# Patient Record
Sex: Female | Born: 1965
Health system: Southern US, Community
[De-identification: ages and names within clinical notes are randomized; demographics above are authoritative.]

## PROBLEM LIST (undated history)

## (undated) DIAGNOSIS — R519 Headache, unspecified: Secondary | ICD-10-CM

## (undated) DIAGNOSIS — Z8489 Family history of other specified conditions: Secondary | ICD-10-CM

## (undated) DIAGNOSIS — N200 Calculus of kidney: Secondary | ICD-10-CM

## (undated) DIAGNOSIS — R51 Headache: Secondary | ICD-10-CM

## (undated) DIAGNOSIS — F32A Depression, unspecified: Secondary | ICD-10-CM

## (undated) DIAGNOSIS — F329 Major depressive disorder, single episode, unspecified: Secondary | ICD-10-CM

## (undated) HISTORY — DX: Headache: R51

## (undated) HISTORY — DX: Headache, unspecified: R51.9

## (undated) HISTORY — DX: Major depressive disorder, single episode, unspecified: F32.9

## (undated) HISTORY — PX: TONSILLECTOMY AND ADENOIDECTOMY: SHX28

## (undated) HISTORY — PX: MOUTH SURGERY: SHX715

## (undated) HISTORY — DX: Depression, unspecified: F32.A

## (undated) HISTORY — PX: LITHOTRIPSY: SUR834

---
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
## 2001-11-30 ENCOUNTER — Encounter: Payer: Self-pay | Admitting: Family Medicine

## 2001-11-30 ENCOUNTER — Encounter: Admission: RE | Admit: 2001-11-30 | Discharge: 2001-11-30 | Payer: Self-pay | Admitting: Family Medicine

## 2002-05-07 ENCOUNTER — Encounter: Admission: RE | Admit: 2002-05-07 | Discharge: 2002-05-07 | Payer: Self-pay | Admitting: Family Medicine

## 2002-05-07 ENCOUNTER — Encounter: Payer: Self-pay | Admitting: Family Medicine

## 2002-05-11 ENCOUNTER — Inpatient Hospital Stay (HOSPITAL_COMMUNITY): Admission: EM | Admit: 2002-05-11 | Discharge: 2002-05-13 | Payer: Self-pay | Admitting: Emergency Medicine

## 2002-05-11 ENCOUNTER — Encounter: Payer: Self-pay | Admitting: Emergency Medicine

## 2002-05-12 ENCOUNTER — Encounter: Payer: Self-pay | Admitting: Internal Medicine

## 2003-03-07 ENCOUNTER — Inpatient Hospital Stay (HOSPITAL_COMMUNITY): Admission: EM | Admit: 2003-03-07 | Discharge: 2003-03-12 | Payer: Self-pay | Admitting: Emergency Medicine

## 2003-05-29 ENCOUNTER — Inpatient Hospital Stay (HOSPITAL_COMMUNITY): Admission: EM | Admit: 2003-05-29 | Discharge: 2003-05-31 | Payer: Self-pay | Admitting: Emergency Medicine

## 2006-03-22 ENCOUNTER — Emergency Department (HOSPITAL_COMMUNITY): Admission: EM | Admit: 2006-03-22 | Discharge: 2006-03-22 | Payer: Self-pay | Admitting: Emergency Medicine

## 2006-04-04 ENCOUNTER — Ambulatory Visit (HOSPITAL_COMMUNITY): Admission: RE | Admit: 2006-04-04 | Discharge: 2006-04-04 | Payer: Self-pay | Admitting: Gastroenterology

## 2006-05-07 ENCOUNTER — Encounter: Admission: RE | Admit: 2006-05-07 | Discharge: 2006-05-07 | Payer: Self-pay | Admitting: Gastroenterology

## 2008-04-07 ENCOUNTER — Emergency Department (HOSPITAL_COMMUNITY): Admission: EM | Admit: 2008-04-07 | Discharge: 2008-04-07 | Payer: Self-pay | Admitting: Emergency Medicine

## 2010-06-12 NOTE — Discharge Summary (Signed)
Kathleen Hays, Kathleen Hays                       ACCOUNT NO.:  [id number]   MEDICAL RECORD NO.:  [id number]                   PATIENT TYPE:  INP   LOCATION:  0370                                 FACILITY:  [facility]   PHYSICIAN:  Rosanne Sack, M.D.         DATE OF BIRTH:  [birth year]   DATE OF ADMISSION:  03/07/2003  DATE OF DISCHARGE:  03/12/2003                                 DISCHARGE SUMMARY   CHIEF COMPLAINT:  Facial pain, anorexia, hypotension.   DISCHARGE DIAGNOSES:  1. Hypotension and severe dehydration, resolved with intravenous hydration.     a. At discharge, taking oral fluids well.  2. Hypokalemia/hypophosphatemia secondary to malnutrition.     a. Phosphorus and potassium normalized, status post replacement.     b. Continues on Neutra-Phos supplementation post discharge.  Request        repeat magnesium, phosphorus, potassium levels within 1 week post        discharge.  3. Leukocytosis and low-grade fever, ? viral etiology.     a. Resolved leukocytosis and normalized temperature, despite        discontinuance of initial antibiotic therapy.     b. Chest x-ray done March 08, 2003 noted mild chronic obstructive        pulmonary disease with no acute abnormality.     c. CT scan of the sinuses, March 08, 2003, noted bilateral maxillary        sinus retention cysts but no sinusitis.     d. Urinalysis was unremarkable.  4. Anorexia nervosa and major depression.     a. Status post psychiatry consult, Dr. Antonietta Breach.  Axis I diagnoses        -- major depression disorder described as severe, anorexia nervosa,        and anxiety disorder, unspecific, rule out posttraumatic stress        disorder.  5. Headache, resolved.  6. History of hospitalization at [facility], May 11, 2002 through May 29, 2002, under Dr. Lina Sar for anorexia and colonic ileus.     a. History of irritable bowel syndrome.     b. History of chronic recurrent sinusitis, followed  previously by Dr.        Zola Button T. Wolicki, ears, nose and throat.     c. History of laxative abuse and abuse of carbohydrate blockers.     d. Apparent history of elevated liver function tests per primary care,        [facility].  7. Allergies listed:  No known drug allergies.   DISCHARGE MEDICATIONS:  1. Lexapro 20 mg daily.  2. Multivitamin 1 tablet daily.  3. Librium 25 mg 4 times daily.  4. Nicotine patch 21 mg daily to wean per manufacturer's instructions and     obtain over the counter.  5. Wellbutrin XL 150 mg daily.  6. Protonix 40 mg once daily.  7. Neutra-Phos 1 packet twice daily.  SPECIAL DISCHARGE INSTRUCTIONS:  1. Disposition:  Patient discharged to [facility] located in     [place].  2. Activity unrestricted.  3. Diet unrestricted.  4. Request followup metabolic panel with electrolytes, phosphorus, magnesium     level within 1 week post discharge.  5. Request followup, [facility], post discharge from [place] facility.  6. Repeat serum TSH and free T4 two to three weeks post discharge (see labs     below).   CONSULTS OVER THE COURSE OF HOSPITALIZATION:  Dr. Jeanie Sewer, psychiatry.   PROCEDURES OVER THE COURSE OF HOSPITALIZATION:  CT scan of the sinuses done,  March 08, 2003, with report as above.   DISCHARGE LABORATORIES:  Serum phosphorus done March 11, 2003 was 2.9.  CBC, March 09, 2003, found WBC 7.2, hemoglobin 14.4, hematocrit 42.1,  platelets 221,000.  A metabolic panel, March 09, 2003:  Sodium 140,  potassium 3.8, chloride 108, CO2 29, BUN 4, and creatinine 0.7.  Glucose was  102.  Admission liver function tests were unremarkable except for a slightly  low albumin at 3.4.  Followup potassium level, March 10, 2003, was 4.2.  Serum TSH was low at 0.114 with a free T4 low at 0.66, essentially ruling  out hyperthyroidism.  Serum cortisol A.M. mildly elevated at 23.9.  HCG   quantitative less than 2.  Drug screen positive for benzodiazepine only.  Urinalysis unremarkable.   RADIOLOGY:  As above.   HISTORY OF PRESENT ILLNESS:  [age]-year-old female followed as an  outpatient of Dr. Marjory Lies in primary care, [facility].  She presented to the emergency room, March 07, 2003, following  a 2-day history of facial pain, headache described as severe.  She was seen  by Dr. Lazarus Salines, ENT, for what sounds like recurrent sinusitis and postnasal  drip.  She complained of left ear pain.  She could not arrange an  appointment with Dr. Lazarus Salines and came to the emergency room.  She was found  to be hypotensive initially and not responsive to IV fluids but eventually  came up in her blood pressure after several fluid boluses.  The patient has  a history of anorexia per her husband's report.  She was hospitalized May 11, 2002 through May 30, 2002 for acute colonic ileus secondary to starvation  state, laxative abuse, major depression.  She has been followed for the  latter by Dr. Cleone Slim.  Depression and anorexia followed by Dr. Pricilla Riffle,  psychology.  She has refused inpatient treatment in the past.  For dictated  review of systems, admission medications, physical exam, laboratory data,  impressions and plan, please see dictated admission and physical dated  March 07, 2003.   HOSPITAL COURSE:  PROBLEM #1 - HYPOTENSION SECONDARY TO SEVERE DEHYDRATION:  The patient was found hypotensive by the emergency room staff at [facility].  She was bolused to IV fluids and eventually her blood pressures  increased to 100/70.  Her metabolic panel suggested dehydration with  elevation in her BUN and creatinine to 10 and 0.8.  Her potassium was low at  3.1.  She was hydrated with IV fluids and her diet initiated.  With  hydration, her BUN and creatinine normalized and by discharge, BUN was 4 and  creatinine 0.7.  By discharge, she is taking fluids and  meals well.  PROBLEM #2 - HYPOKALEMIA AND HYPOPHOSPHATEMIA THOUGHT SECONDARY TO  MALNUTRITION:  On admission, serum potassium low at  3.1.  This was  supplemented and a magnesium and phosphorus level were checked.  Serum  magnesium on admission was 2.4, in normal range, but phosphorus low, 1.9.  The patient was supplemented first with IV phosphorus and later oral  phosphorus.  Her serum phosphorus level has gradually improved and by  March 11, 2003, had normalized to 2.9.  The patient is continued on  Neutra-Phos post discharge.  She should have repeat metabolic panel,  magnesium, phosphorus levels within 1 week post discharge.   PROBLEM  #3 - LEUKOCYTOSIS AND LOW-GRADE FEVER:  On presentation, the  patient was noted to have a low-grade fever in the 99 range.  Her WBC was  noted elevated at 13.8.  She was started on Augmentin antibiotic therapy  with a known history of recurrent sinusitis.  A CT scan of the sinuses  obtained indicated maxillary sinus retention cysts but no acute sinusitis.  A chest x-ray was obtained and this was unremarkable.  Urinalysis was  obtained and this was unremarkable.  Her antibiotics were discontinued and  despite this, the patient's fevers resolved and her leukocytosis normalized  to 7.2.  Etiology felt likely to be a viral infection.   PROBLEM #4 - ANOREXIA NERVOSA AND DEPRESSION:  The patient does have a  previous listed history per husband report of anorexia.  Noted laxative and  carbohydrate blocker abuse.  She had been followed as an outpatient by Drs.  Karb and Pyle.  She had an inpatient psychiatry consult by Dr. Jeanie Sewer.  Axis I diagnoses of major depressive disorder described as severe, anorexia  nervosa, and anxiety disorder, unspecified, rule out post-traumatic stress  disorder.  Discussions were conducted with the patient, her husband  regarding possible placement in an anorexia treatment program (inpatient)  within the state of [place].  Apparently, this was unavailable.  Arrangements have been made for placement at [facility] in [place] and the patient will be discharged directly there for treatment.  Following discharge from [facility], the patient is to follow up  with her primary care provider at [facility].   PROBLEM #5 - HEADACHE:  On presentation, the patient described a headache as  quite severe.  CT scan of the sinuses obtained as indicated above.  No  indication of acute sinusitis.  Most likely etiology again felt to be a  viral infection which has resolved.  There have been no further complaints  of headache.   PROBLEM #6 - ABNORMAL THYROID FUNCTION TESTS:  The patient did have a serum  TSH obtained on admission which was low at 0.114.  A free T4 was obtained to  evaluate for hyperthyroidism and this was also low, 0.66, essentially ruling  out hyperthyroidism.  The etiology of abnormal thyroid function tests unclear but possibly malnutrition.  Would follow up a TSH level 2 to 3 weeks  post discharge and include a free T4 level.   DISPOSITION:  Discharged to [facility] [place], for further  treatment of anorexia nervosa as an outpatient there.   ACTIVITY:  Unrestricted.   CONDITION AT TIME OF DISCHARGE:  Stable/improved.   DISCHARGE PROCESS:  Greater than 30 minutes, 7:15 a.m. through 8:10 a.m.     Everett Graff, N.P.                     Rosanne Sack, M.D.    TC/MEDQ  D:  03/12/2003  T:  03/12/2003  Job:  [id number]   cc:   Marjory Lies, M.D.  [address]  [place]  [place] [address]  Fax: [phone]   Gloris [place]. Lazarus Salines, M.D.  [address] [place]  [place] [address]  Fax: [phone]   Dr. Cleone Slim   Dr. Pricilla Riffle, psychology   Antonietta Breach, M.D.  [address]  [place], [place] [address]  Fax: [phone]

## 2010-06-12 NOTE — H&P (Signed)
NAME:  Kathleen Hays, Kathleen Hays                       ACCOUNT NO.:  [id number]   MEDICAL RECORD NO.:  [id number]                   PATIENT TYPE:  INP   LOCATION:  1824                                 FACILITY:  [facility]   PHYSICIAN:  Karlene Einstein, M.D.             DATE OF BIRTH:  05/12/1965   DATE OF ADMISSION:  05/29/2003  DATE OF DISCHARGE:                                HISTORY & PHYSICAL   CHIEF COMPLAINT:  Drug overdose.   HISTORY OF PRESENT ILLNESS:  A 45 year old, white female who was brought to  [facility] by husband secondary to drug overdose early this  morning.  The patient has a long history of anorexia just treated the first  part of this year as inpatient x30 days.  Since then, she has been depressed  about her weight.  He saw her last at 10:30 p.m. when he went to bed.  The  patient woke her husband up around 3 a.m. stating that the room was  spinning.  Husband checked her medication bottles and found that recently  filled bottle of 20 mg Lexapro was empty.  He says she must have taken as  many as 23 pills because according to the pill count, only seven pills  should be missing; however, the bottle was empty.  At that time, the patient  was not answering questions.  Currently, in the emergency room, the patient  says that she feels better.  The room is not spinning anymore, but she  cannot get up and maintain her balance.  She is not hungry.  When asked  about whey she took so many pills, all she said is that I must have  mistaken it for aspirin.  She was taking aspirin daily for stuffed ears.  She says she has recurrence in her ears for which she takes aspirin.  She  swears that she only took the maximum limit.  The patient denies suicidal  attempt.   REVIEW OF SYMPTOMS:  CONSTITUTIONAL:  No fevers, chills or night sweats.  HEENT:  Eyes with no blurry vision, no diplopia or dry eyes.  Bilateral ear  congestion and tinnitus, but no epistaxis or bleeding.   RESPIRATORY:  No  cough, shortness of breath or chest pain.  CARDIOVASCULAR:  No palpitations,  edema.  GASTROINTESTINAL:  No abdominal pain, nausea, diarrhea or vomiting.  GU:  No dysuria, hematuria or increased frequency.  MUSCULOSKELETAL:  No  pain, weakness or stiffness.  SKIN:  No rash, pruritus or purpura.  NEUROLOGIC:  Had balance problems, otherwise no numbness or tingling.  Feels  somewhat weak.   PAST MEDICAL HISTORY:  Severe depression and anorexia, but no history of  suicidal attempts.   SOCIAL HISTORY:  Denies alcohol and smoking.  She is currently married and  lives with her husband.   FAMILY HISTORY:  Significant for suicide in her father.   ALLERGIES:  No known drug allergies.  CURRENT MEDICATIONS:  1. Lexapro 20 mg q.d.  2. Wellbutrin.  3. Protonix 40 mg q.d.  4. Aspirin.   PHYSICAL EXAMINATION:  VITAL SIGNS:  Temperature 98.1, blood pressure 88/45,  pulse 81, respirations 16, pulse oximetry 98% on room air.  GENERAL:  No acute distress.  Cooperative.  HEENT:  Pupils equal round and reactive to light and accommodation.  Extraocular movements intact.  Oropharynx moist and clear.  Tympanic  membranes intact.  Mild cerumen in bilateral ears.  NECK:  Supple.  No JVD or thyromegaly.  No cervical lymphadenopathy.  LUNGS:  Clear to auscultation bilaterally.  No rales, rhonchi or wheezing.  HEART:  Regular rate and rhythm with normal S1, S2.  No murmurs, rubs or  gallops.  ABDOMEN:  Soft, nontender, nondistended.  Decreased bowel sounds.  No  organomegaly.  EXTREMITIES:  2+ pulses.  No edema.  SKIN:  No rashes, purpura or petechiae.  MUSCULOSKELETAL:  Full range of motion.  No tenderness or deformities.  NEUROLOGIC:  Alert and oriented x3.  No focal deficits.  Gait not tested.   LABORATORY DATA AND X-RAY FINDINGS:  Creatinine 0.8, sodium 140, potassium  3.4, chloride 110, BUN 20, glucose 102.  ABG with pCO2 30.6, bicarb 18.7.  Hemoglobin 16.3.  Alcohol level less  than 5.  Salicylate level 27.6.  Troponin I 0.01.  Acetaminophen level less than 10.  Repeat blood work shows  acetaminophen level less than 10, salicylate level 20.7.  Sodium 139,  potassium 3.5, bicarb 21, glucose 98, creatinine 0.7, calcium 7.6.   EKG shows normal sinus rhythm, prolonged QT, otherwise normal EKG.   ASSESSMENT/PLAN:  1. Drug overdose.  Salicylate and Lexapro which is a selective serotonin     reuptake inhibitor.  The patient is symptomatically improved.  Her drug     levels are also coming down.  Electrolytes are normal.  I will admit her     to intensive care unit mainly for observation and management of any     complications.  Will hold all her medications for now.  Will use suicide     precautions, although she is in denial.  Also, consider psychiatric     consult.  Will recheck a basic metabolic panel, salicylate level and     urinary pH in two hours.  Also, check urine pregnancy test, magnesium     level and protime according to poison center guidelines.  2. Severe depression.  She was on Lexapro and Wellbutrin.  Will hold off on     those medications.  Consider psychiatric consult.  3. Anorexia.  She just completed an inpatient program in [place].  4. Gastroesophageal reflux disease.  Hold Protonix for now.  5. Bilateral ear congestion.  No sign of infection.  I think her symptoms     are secondary to salicylate overdose.  6. Decreased blood pressure.  Fluid boluses were given.  Follow closely.  7. The patient will be followed by Dr. Sharon Seller in the hospital.                                                Karlene Einstein, M.D.    GD/MEDQ  D:  05/29/2003  T:  05/29/2003  Job:  [id number]

## 2010-06-12 NOTE — H&P (Signed)
NAME:  Kathleen Hays, Kathleen Hays                       ACCOUNT NO.:  [id number]   MEDICAL RECORD NO.:  [id number]                   PATIENT TYPE:  EMS   LOCATION:  ED                                   FACILITY:  [facility]   PHYSICIAN:  Lina Sar, M.D. [facility]               DATE OF BIRTH:  1965/04/19   DATE OF ADMISSION:  05/11/2002  DATE OF DISCHARGE:                                HISTORY & PHYSICAL   CHIEF COMPLAINT:  Abdominal pain and bloating.   HISTORY OF PRESENT ILLNESS:  The patient is a 45 year old white female,  primary patient of Dr. Doristine Counter, with no chronic previously diagnosed medical  problems except irritable bowel syndrome, also with chronic sinus problems.  She was seen in consult by Dr. Arlyce Dice apparently a few weeks ago for  abdominal pain and bloating, and had undergone colonoscopy which was  apparently negative with the exception of one small polyp.  She had been  started on Zelnorm approximately one month ago, and had been overusing this  without much success.  She had called and then was started on Reglan 5 mg  t.i.d. and told to decrease the Zelnorm to b.i.d. which apparently seemed to  help briefly.   The patient and her husband state that most of her GI symptoms have all  started within the past couple of months and have been worse over the past  few weeks, and acutely worse though with constant abdominal discomfort and  distention associated with nausea over the past five days.  She has been  unable to eat over the past four days due to abdominal tightness, and says  she has been having sweats off and on as well.  She had one episode of  vomiting.  Her stools have been loose with two to three bowel movements per  day, no melena or heme.  After further discussion, the patient admits to  using laxatives over the past couple of years, including Correctol, Surfact,  Dulcolax, etc.  Also has been using Car Blockers and Stacker II products  which I believe are stimulants,  creatine muscle builder type products both  for the past couple of years and in higher then the recommended doses.  She  also has been following a high protein, low carb diet.  Her husband  relates she generally eats a protein bar in the morning and a salad for  dinner and not much else.  Her weight has dropped from 140 to 110 over the  past year, and despite this has continued using all of the above until this  past Monday with her acute abdominal distention.  She denies other substance  abuse, herbal products, etc.  Denies alcohol use.  She does admit to  significant depression which has been worse over the past few months, and  has never been treated.  Her husband says she has been depressed since 1991  when her father  committed suicide and the patient found him.  She is tearful  today about that and says she misses her father.  She currently denies any  suicidal ideation, but says she might if somebody doesn't help me, I feel  terrible.  The patient is admitted at this time for further diagnostic  evaluation and treatment to include psychiatric evaluation.   LABORATORY DATA:  White blood cell count 7.5, hemoglobin 14, hematocrit 39,  MCV 97, prothrombin time 11.7, INR 0.8, potassium 3, chloride 115, BUN 15,  creatinine 0.8.  Alkaline phosphatase 33, albumin 3.3, venous ammonia is 32,  lipase is 79.  Beta hCG was negative, and urinalysis was negative.  KUB in  the emergency room shows an adynamic ileus.  CT scan of the abdomen and  pelvis shows a complete lack of fat with some evidence of subcutaneous edema  consistent with a cachectic/malnourished state making CT scan difficult.  She is noted to have multiple low density lesions in the liver, probable  multiple small cysts.  Her colon is diffusely distended and fluid filled,  and her pancreas is felt to be normal.   CURRENT MEDICATIONS:  1. Flonase two sprays b.i.d.  2. Reglan 5 mg t.i.d.  3. Zelnorm 6 mg b.i.d. and supplements as  outlined.  4. Also question if she is taking creatine.   ALLERGIES:  No known drug allergies.   PAST MEDICAL HISTORY:  1. Status post T&A.  2. Status post cesarean section x1.  No other hospitalizations or known chronic problems.   FAMILY HISTORY:  One aunt with colon CA.  Mother with thyroid disease.  Father deceased secondary to suicide.   SOCIAL HISTORY:  The patient is married, not currently employed.  She has  two children.  She is a smoker one pack per day.  No ETOH.   REVIEW OF SYMPTOMS:  CARDIOVASCULAR:  Denies any chest pain or anginal  symptoms.  PULMONARY:  Denies any cough, shortness of breath, or sputum  production.  GENITOURINARY:  Denies any dysuria, urgency, or hematuria.  MUSCULOSKELETAL:  Denies myalgias, arthralgias, etc.  SKIN:  The patient has  had a chronic rash on her feet over the past several months and a scaly  lesion on her left calf which she says has been present for a long time.  NEUROLOGIC:  Depression as above.  GYNECOLOGIC:  No menses for several  months.  No GYN examination for several years.   PHYSICAL EXAMINATION:  GENERAL:  A well-developed chronically ill-appearing  white female with hat and hair extensions.  She is tearful, little eye  contact, husband is in the room.  VITAL SIGNS:  Temperature 97.9, blood pressure 114/60, pulse 80,  respirations 16.  HEENT:  Normocephalic, atraumatic.  Extraocular movements were intact.  Pupils equal, round, reactive to light and accommodation.  Sclerae  anicteric.  NECK:  Supple.  There are no nodes, no JVD.  CARDIOVASCULAR:  Regular rate and rhythm with S1 and S2 and occasional  ectopic.  PULMONARY:  Clear.  ABDOMEN:  Distended, somewhat tympanitic.  Bowel sounds are hyperactive.  There is no focal tenderness, rather diffusely mildly tender, no palpable  hepatosplenomegaly.  RECTAL:  Heme negative and green per Dr. Beverely Pace. EXTREMITIES:  There is 1+ edema in the lower extremities, chronic-appearing   purpuric lesions on the feet, and a large scaly lesion on the left calf.  NEUROLOGIC:  Grossly nonfocal.  Gait not tested.   IMPRESSION:  83. A 45 year old white female with colonic ileus  likely secondary to     metabolic derangement with starvation state, laxative abuse, etc.  Rule     out underlying motility disorder.  2. Eating disorder with abuse of laxatives, supplements, carb blockers, etc.     Also question a component of anorexia.  3. Malnutrition secondary to above.  4. Weight loss secondary to above.  5. Depression, chronic, with acute major depression.  6. Multiple liver lesions, cysts versus other, may need further evaluation.  7. Colon polyp.   PLAN:  1. The patient is admitted to the service of Dr. Lina Sar for IV fluid     hydration.  We will replace her potassium, check magnesium and phosphorus     levels, and replace as needed.  She will be put on a liquid diet for the     time being, IV Reglan.  We will hold the Zelnorm.  I have consulted     psychiatry, and she may need eventual inpatient stay as well.  May need     to consider TNA unless she can get back to eating within the next few     days.  She and her husband were told that she absolutely needs to stop     all supplements, laxatives, etc., and that it is causing her significant     medical problems and that these are causing her significant medical     problems.  2. Consider MRI of the liver.   For details, please see the orders.       Mike Gip, P.A.-C. [facility]                Lina Sar, M.D. [facility]    AE/MEDQ  D:  05/11/2002  T:  05/11/2002  Job:  [id number]   cc:   Marjory Lies, M.D.  [address]  [place]  [place] [address]  Fax: [phone]

## 2010-06-12 NOTE — Discharge Summary (Signed)
NAME:  Kathleen Hays, Kathleen Hays                       ACCOUNT NO.:  [id number]   MEDICAL RECORD NO.:  [id number]                   PATIENT TYPE:  INP   LOCATION:  0463                                 FACILITY:  [facility]   PHYSICIAN:  Lina Sar, M.D. [facility]               DATE OF BIRTH:  08-Dec-1965   DATE OF ADMISSION:  05/11/2002  DATE OF DISCHARGE:  05/13/2002                                 DISCHARGE SUMMARY   ADMITTING DIAGNOSES:  1. The patient is a 45 year old white female with acute colonic ileus likely     secondary to a metabolic derangement with starvation state, laxative     abuse, etc., rule out underlying motility disorder.  2. Probable eating disorder with abuse of laxatives, supplements, carb     blockers, etc.  Also question component of anorexia.  3. Malnutrition secondary to above.  4. Weight loss secondary to above.  5. Depression, chronic, with probable acute major depression.  6. Multiple liver lesions, cysts versus other, may need further evaluation.  7. History of a colon polyp.  8. Status post cesarean section x1 and tonsillectomy and adenoidectomy.   DISCHARGE DIAGNOSES:  1. Colonic ileus, resolved.  2. Probable eating disorder with abuse of laxatives, supplements, carb     blockers, etc.  Also question component of anorexia.  3. Malnutrition secondary to above.  4. Weight loss secondary to above.  5. Depression, chronic, with probable acute major depression.  6. Multiple liver lesions, cysts versus other, may need further evaluation.  7. History of a colon polyp.  8. Status post cesarean section x1 and tonsillectomy and adenoidectomy.   CONSULTATION:  Psychiatric consult was requested on admission.  The patient  was discharged prior to being evaluated.   RADIOGRAPHIC STUDIES:  CT scan of the abdomen and pelvis and plain abdominal  films.   BRIEF HISTORY:  The patient is a 45 year old white female primary patient of  Dr. Doristine Counter with no chronic previously  diagnosed medical problems other than  irritable bowel.  She has had some chronic sinus difficulty.  She had been  seen in consult by Dr. Melvia Heaps a few weeks ago for abdominal pain and  bloating and had undergone colonoscopy which was negative with the exception  of one small polyp.  She was started on Zelnorm at that time but had been  overusing this medication without much success.  She had called our office  and was started on Reglan 5 mg three times daily and told to decrease her  Zelnorm to b.i.d. dosing which did seem to help briefly.  She presents now  to the emergency room with acute progressive abdominal discomfort and  distension with nausea and vomiting x5 days.  She and her husband report  that all of her GI symptoms started within the past couple of months and  have been worse over the past few weeks.  She related that she had  been  unable to eat for 4 days due to abdominal distension and tightness and had  been having sweats off and on as well as one episode of vomiting.  She was  having two to three loose bowel movements per day without melena or  hematochezia.  After further discussion with the patient and her husband it  was elicited that she had been using laxatives over the past couple of years  in various forms including Correctol, Surfak, Dulcolax, etc., has also been  using and/or overusing carb blockers and Stacker 2 products which I believe  have a stimulant effect, also probably a creatine supplement - all for  weight loss benefits.  She says she follows a very high protein, low carb  diet and her husband says she really does not eat much at all.  She has lost  30 pounds over the past year from 140 down to 110 and despite this has  continued all of the above supplements, etc.  She denies any substance abuse  or alcohol abuse.  She and her husband both relay that she has had  significant problems with depression which has been worse over the past few  months  and has never been treated with medication.  She has not seen a  Veterinary surgeon.  She was tearful in the emergency room talking about her father  who had committed suicide and admitted that she was very depressed but not  suicidal herself.  It was felt because of all of these problems both  physical and psychiatric in addition to metabolic derangement and evidence  of  ileus on CT scan that the patient should be admitted to the hospital for  further diagnostic evaluation and supportive medical management.   LABORATORY STUDIES:  On 05/11/2002 WBC 7.5, hemoglobin 14, hematocrit of  39.9, MCV of 97.7, platelets 194.  Followup on 05/12/2002 WBC of 6.5,  hemoglobin 14, hematocrit of 40.7, platelets 190.  Sed rate was 1.  Prothrombin time 11.7, INR 0.8, PTT of 26.  On admission potassium 3,  chloride 115, BUN 15, creatinine 0.8, albumin 3.3.  Followup on 05/12/2002  sodium 143, potassium 4.1, BUN 9, creatinine 0.8.  Liver function studies  normal.  Amylase 66, lipase of 79; followup was 63.  Venous ammonia was 32,  magnesium 2, phosphorus 3.4.  TSH 0.74.  Urine pregnancy test negative.  Prealbumin 22.4.  Drug screen negative.  UA negative.   RADIOGRAPHIC STUDIES:  KUB on 05/11/2002 showed an adynamic ileus with  scattered air-fluid levels in the large and small bowel.  CT scan of the  abdomen on 05/11/2002 shows lack of intraperitoneal and subcutaneous fat  with the visible fat appearing relatively edematous, this is nonspecific but  can be seen with cachexia and a malnourished state, diffuse fluid-filled  distension of the colon, nonspecific, otherwise negative.  Followup  abdominal films on 05/12/2002 showed a nonspecific gas pattern.   HOSPITAL COURSE:  The patient was admitted to the service of Dr. Lina Sar  who was covering the hospital.  She was rehydrated in the emergency room and then continued on IV fluids.  She was started on IV Reglan, kept on a clear  liquid diet.  We held her  Zelnorm and replaced her potassium.  She felt  significantly better by the following morning from the ileus standpoint and  abdominal films were improved, her diet was advanced and she was seen by Dr.  Virginia Rochester on 05/13/2002 who was covering on call, potassium was  corrected, her  abdomen was back to her baseline and she was feeling much better.  The  patient was requesting discharge and she was discharged to home to follow up  in the office with Dr. Arlyce Dice.  We have requested a psychiatric consult on  the evening of admission however, they were unable to evaluate her over the  weekend while she was in the hospital.  She was discharged on her usual  medications Flonase two sprays b.i.d., Reglan 5 mg three times daily, the  Zelnorm was discontinued, and she was advised to discontinue all of her  supplements, carb blockers, and laxatives.  We planned outpatient  psychiatric followup both for treatment of her depression and probable  underlying eating disorder.    CONDITION ON DISCHARGE:  Improved.   DIET ON DISCHARGE:  Clear to full liquids with gradual advancement as  tolerated.     Mike Gip, P.A.-C. [facility]                Lina Sar, M.D. [facility]    AE/MEDQ  D:  05/30/2002  T:  05/30/2002  Job:  [id number]   cc:   Marjory Lies, M.D.  [address]  [place]  [place] [address]  Fax: [phone]

## 2010-06-12 NOTE — H&P (Signed)
NAME:  Kathleen Hays, Kathleen Hays                       ACCOUNT NO.:  [id number]   MEDICAL RECORD NO.:  [id number]                   PATIENT TYPE:  EMS   LOCATION:  ED                                   FACILITY:  [facility]   PHYSICIAN:  Maxwell Caul, M.D.             DATE OF BIRTH:  Jun 26, 1965   DATE OF ADMISSION:  03/07/2003  DATE OF DISCHARGE:                                HISTORY & PHYSICAL   CHIEF COMPLAINT:  Facial pain, anorexia and hypotension.   ID:  [name] who arrived in the ER tonight with her  husband. She is a primary patient of Marjory Lies, M.D. and [facility]   HISTORY OF PRESENT ILLNESS:  The patient came in today with a two day  history of facial pain, headache which was severe. She was seen by Zola Button T.  Wolicki, M.D. for ENT in the past for what sounds like chronic recurrent  sinusitis for which she has had nasal drainage. She also complains of left  ear pain.  She could not arrange an appointment with Dr. Lazarus Salines and  eventually came to the ER.  Here she was found to be hypotensive which was  initially not responsive to IV fluid; however, it has come up after several  boluses.   The patient apparently has a long history of anorexia per her husband. She  was hospitalized from April 16 to May 30, 2002 with an acute colonic ileus  secondary to starvation state, laxative abuse, and major depression. She has  been followed for the latter by Dr. Cleone Slim. Depression and anorexia also  followed by Dr. Pricilla Riffle of psychology. She has apparently refused inpatient  treatment and the husband is really concerned about her going home tonight  and wants [place] to attempt to try to transfer her directly.   PAST MEDICAL HISTORY:  1. In [place] from April 16 to May 29, 2002 under Dr. Juanda Chance with anorexia     and colonic ileus.  2. Depression with anxiety.  3. Irritable bowel.  4. Sinusitis.  5. Laxative abuse and abuse of carb blockers.  6. Apparently  a history of elevated liver function tests done in     [place].   REVIEW OF SYMPTOMS:  CARDIAC:  No chest pain, no palpitations.  RESPIRATORY:  No shortness of breath. GI:  She had diarrhea last night, she has not eaten  much for 48 hours. Apparently in the past, she will starve herself and then  binge with ice cream although there does not seem to be any vomiting.  GU:  She has no periods for at least one year. HEENT:  She is complaining of  sinus and facial pain perhaps ethmoid sinusitis.   MEDICATIONS:  1. Lexapro 20 mg q.d.  2. Librium 25 p.o. q.d.   PHYSICAL EXAMINATION:  VITAL SIGNS:  Blood pressure currently 100/70, pulse  72.  GENERAL:  She  is a thin, disheveled female.  HEENT:  Left ear is obscured by wax. She has poor dentition.  RESPIRATORY:  Clear.  CARDIAC:  Heart sounds are normal. She is not grossly dehydrated currently.  ABDOMEN:  No liver, no spleen. She is nontender.  PSYCHIATRIC:  She is depressed, has suicidal ideation but no plan. She  expresses a wish to attend inpatient therapy for her eating disorder.  Apparently she has been prior approved through [facility] for  this although she has refused to go.   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 She has no history of alcohol, no street  drug abuse. She denies current laxative abuse.   LABORATORY DATA:  Laboratory work to date, white count is 13.8, 82%  neutrophils, hemoglobin 14.5, K 3.1, albumin 3.4, BUN 10, creatinine 0.8,  calcium 8.4, liver function tests are completely normal.  Drug screen is  negative except for BZD's, alcohol is less than 5, her urinalysis is normal.   IMPRESSION:  1. Anorexia nervosa.  I really do not think there is another medical     explanation for this phenomenon.  2. Hypotension.  She is likely mildly dehydrated. I will check a chest x-ray     and EKG and random cortisol.  3. Depression.  I  think this is significant. I do not believe she is     currently suicidal, however, it is a major reason I think she needs to     come in the hospital.  4. Hypokalemia likely secondary to #1 perhaps laxative abuse which I will     replace.  5. Increasing white cell count. The cause of this is not completely obvious.     I wonder whether she might have a sinusitis and I will empirically treat     her for this, I will check a chest x-ray as well.   PLAN:  The emergency room physician was concerned about sending this woman  home and after review I agree with him.  I would like a psyche consult  tomorrow and tonight I will give her fluids and replace her potassium. I am  doubtful that there will be another medical explanation or a problem we  identify as she had a fairly thorough workup for this in April.                                               Maxwell Caul, M.D.    MGR/MEDQ  D:  03/07/2003  T:  03/07/2003  Job:  [id number]

## 2014-03-14 ENCOUNTER — Encounter: Payer: Self-pay | Admitting: Neurology

## 2014-03-15 ENCOUNTER — Encounter: Payer: Self-pay | Admitting: Neurology

## 2014-03-15 ENCOUNTER — Ambulatory Visit (INDEPENDENT_AMBULATORY_CARE_PROVIDER_SITE_OTHER): Payer: 59 | Admitting: Neurology

## 2014-03-15 VITALS — BP 104/70 | HR 68 | Resp 16 | Ht 65.0 in | Wt 150.0 lb

## 2014-03-15 DIAGNOSIS — G43821 Menstrual migraine, not intractable, with status migrainosus: Secondary | ICD-10-CM

## 2014-03-15 DIAGNOSIS — G43909 Migraine, unspecified, not intractable, without status migrainosus: Secondary | ICD-10-CM | POA: Insufficient documentation

## 2014-03-15 DIAGNOSIS — N943 Premenstrual tension syndrome: Secondary | ICD-10-CM

## 2014-03-15 MED ORDER — TOPIRAMATE 25 MG PO TABS: 25.0000 mg | ORAL_TABLET | Freq: Two times a day (BID) | ORAL | Status: DC

## 2014-03-15 MED ORDER — SUMATRIPTAN SUCCINATE 100 MG PO TABS: 100.0000 mg | ORAL_TABLET | ORAL | Status: DC | PRN

## 2014-03-15 NOTE — Progress Notes (Signed)
[facility]   Provider:  Melvyn Novas, M D  Referring Provider: Delorse Lek, MD Primary Care Physician:  Delorse Lek, MD  Chief Complaint  Patient presents with  . Headache    RM 11  - Vision 20/200 both eyes W/O     HPI:  ASHANTAE PANGALLO is a 49 y.o. female ,  seen here as a referral/from Dr. Doristine Counter for an evaluation of nocturnal onset migraines.  Mrs. Millon reports that her migraines begun around age 81. She has a strong family history that I would like to interject here her mother started having migraine headaches in her 85s as does her daughter. The patient reports that her headaches would wake her up at 3 or 4 AM, and usually they occurred at the time of either ovulation or the first day of her menstrual period. She was not on birth control pills or hormone replacement therapy. Never undergone a hysterectomy. Her son was born in 70, her daughter in 94. Her last period was in July 2015.  She now has hot flushes.   The several years she has had seen Dr. Neale Burly and Dr. Meryl Crutch, quit smoking,  modified her diet and received multiple injection therapies. She lost weight and has a regular exercise program.   She states  she still had the same frequency of migraines but that the severity and intensity was less. There was never any aura, and no olfactory triggers.  At the current time she still has left frontal onset headaches- as she has had all these years they usually start in the middle of the night around 3 AM a headache for last up to 3 days if she can not ' nip"  that. Her headaches however are associated with nausea, photophobia and phonophobia, she is unable to drive during her headaches because she feels very dizzy and vertiginous. She has had nausea to the point of vomiting. currently 1-2 a month, not longer katamenial.  Tried Excedrin, Tylenol, ibuprofen, aspirin and she has used pressure points massage. She finds Excedrin helps her the best if she  has an over-the-counter axis, Dr. Doristine Counter recently prescribed Imitrex that 100 mg and if she takes these she is immediately pain-free. She has never used phenergan/ zofran .  Her food triggers are chocolate, coffee, tea, soda. Peanut butter is a trigger.  The patient had a tonsillectomy at age , in 65.  Review of Systems: Out of a complete 14 system review, the patient complains of only the following symptoms, and all other reviewed systems are negative.   Epworth score 5  , Fatigue severity score 25   , depression score 0    History   Social History  . Marital Status: Single    Spouse Name: [name]  . Number of Children: 2  . Years of Education: RN   Occupational History  . RN  [employer]   Social History Main Topics  . Smoking status: Former Games developer  . Smokeless tobacco: Never Used     Comment: Quit 2015  . Alcohol Use: No  . Drug Use: No  . Sexual Activity: Not on file   Other Topics Concern  . Not on file   Social History Narrative   Patient lives at home with her husband [name]  and daughter.   Patient works at [employer]    [employer] Service fast streamer.   Right handed.   Caffeine none.    Family History  Problem Relation Age  of Onset  . High blood pressure Mother   . Migraines Mother     Past Medical History  Diagnosis Date  . HA (headache)     Past Surgical History  Procedure Laterality Date  . Cesarean section    . Tonsillectomy and adenoidectomy    . Mouth surgery      Current Outpatient Prescriptions  Medication Sig Dispense Refill  . SUMAtriptan (IMITREX) 100 MG tablet Take 100 mg by mouth every 2 (two) hours as needed for migraine or headache. May repeat in 2 hours if headache persists or recurs.     No current facility-administered medications for this visit.    Allergies as of 03/15/2014 - Review Complete 03/15/2014  Allergen Reaction Noted  . Avelox [moxifloxacin hcl in nacl]  03/14/2014    Vitals: BP 104/70 mmHg  Pulse  68  Resp 16  Ht 5\' 5"  (1.651 m)  Wt 150 lb (68.04 kg)  BMI 24.96 kg/m2 Last Weight:  Wt Readings from Last 1 Encounters:  03/15/14 150 lb (68.04 kg)       Last Height:   Ht Readings from Last 1 Encounters:  03/15/14 5\' 5"  (1.651 m)    Physical exam:  General: The patient is awake, alert and appears not in acute distress. The patient is well groomed. Head: Normocephalic, atraumatic. Neck is supple. Mallampati 2   neck circumference:13 . Nasal airflow unrestricted , TMJ is not evident . Retrognathia is not seen.  Cardiovascular:  Regular rate and rhythm  without  murmurs or carotid bruit, and without distended neck veins. Respiratory: Lungs are clear to auscultation. Skin:  Without evidence of edema, or rash Trunk:  normal posture.  Neurologic exam : The patient is awake and alert, oriented to place and time.   Memory subjective described as intact. There is a normal attention span & concentration ability.  Speech is fluent without dysarthria, dysphonia or aphasia. Mood and affect are appropriate.  Cranial nerves: Pupils are equal and briskly reactive to light. Funduscopic exam without evidence of pallor or edema. Extraocular movements  in vertical and horizontal planes intact and without nystagmus. Visual fields by finger perimetry are intact. Hearing to finger rub intact.  Facial sensation intact to fine touch. Facial motor strength is symmetric and tongue and uvula move midline.  Motor exam:  Normal tone, muscle bulk and symmetric,  strength in all extremities.  Sensory:  Fine touch, pinprick and vibration were tested in all extremities. Proprioception is  normal.  Coordination: Rapid alternating movements in the fingers/hands is normal. Finger-to-nose maneuver  normal without evidence of ataxia, dysmetria or tremor.  Gait and station: Patient walks without assistive device and is able unassisted to climb up to the exam table. Strength within normal limits. Stance is stable  and normal. Tandem gait is unfragmented. Romberg testing is  negative.  Deep tendon reflexes: in the  upper and lower extremities are symmetric and intact.    Assessment:  After physical and neurologic examination, review of laboratory studies, imaging, neurophysiology testing and pre-existing records, assessment is   1) Ketamenial migraines , now less frequent as menopause approaches. She has strong phono- , photophobia and nausea associated with left sided forehead headaches. No cluster and no sinus component.    Plan:  Treatment plan and additional workup : The patient was advised of the nature of the diagnosed  disorder , the treatment options and risks for general a health and wellness arising from not treating the condition. Visit duration was 35  minutes.  Of this face to face time was 50% spent in discussion and education and answering the patients questions.   Please note that Mrs. Vossler has so far not been on a preventative medication,  as she still can lose 2 days a months to headaches and sometimes headaches lasting 3 days in duration: it is the best step at this time to provide a preventative prophylactic medication.  Since she has a low heart rate at baseline and rather low blood pressures I will the stain from using a beta blocker for prophylaxis and instead offer topiramate.   Topamax at 25 mg nightly should be able to control her rather infrequent migrainous headaches. For the acute treatment I would continue to prescribe Imitrex. She does not exceed 2 or 3 a month so the insurance limits for 6 tablets per month will suffice. I have offered Phenergan or Zofran for the treatment of nausea she can use this when necessary she is aware that this is also going to make her very sleepy. Neither Zofran on all Phenergan can be taken if she plans to drive or work that day.       Porfirio Mylar Dohmeier MD  03/15/2014

## 2015-01-02 ENCOUNTER — Telehealth: Payer: Self-pay | Admitting: Neurology

## 2015-01-02 NOTE — Telephone Encounter (Signed)
Patient called regarding SUMAtriptan (IMITREX) 100 MG tablet, has enough to get her through January, needs to know what to do so she can continue to get more refills, does Dr. Vickey Hugerohmeier require an office visit or can she continue to refill medication without office visit. Please call to advise.

## 2015-01-02 NOTE — Telephone Encounter (Signed)
Spoke to pt, pt hasn't been seen since 02/2014, and was advised to follow up in four months. Pt is agreeable to coming in for an appt. She hasn't been keeping a headache diary, but can describe when and how long her migraines come on. Needs a refill on imitrex and wants to be seen in January. Appt made with pt for 02/13/15 at 9:30. Pt verbalized understanding.

## 2015-02-13 ENCOUNTER — Ambulatory Visit (INDEPENDENT_AMBULATORY_CARE_PROVIDER_SITE_OTHER): Payer: 59 | Admitting: Neurology

## 2015-02-13 ENCOUNTER — Encounter: Payer: Self-pay | Admitting: Neurology

## 2015-02-13 VITALS — BP 112/78 | HR 62 | Resp 20 | Ht 65.0 in | Wt 151.0 lb

## 2015-02-13 DIAGNOSIS — N943 Premenstrual tension syndrome: Secondary | ICD-10-CM

## 2015-02-13 DIAGNOSIS — G43821 Menstrual migraine, not intractable, with status migrainosus: Secondary | ICD-10-CM | POA: Diagnosis not present

## 2015-02-13 MED ORDER — SUMATRIPTAN SUCCINATE 100 MG PO TABS: 100.0000 mg | ORAL_TABLET | ORAL | Status: DC | PRN

## 2015-02-13 NOTE — Progress Notes (Signed)
[facility]   Provider:  Melvyn Novas, M D  Referring Provider: Delorse Lek, MD Primary Care Physician:  Delorse Lek, MD  Chief Complaint  Patient presents with  . Follow-up    has one migraine every 6-8 weeks, sometimes controlled with excedrin migraine, if doesn't work goes to [facility], rm 10, alone    HPI:  Kathleen Hays is a 50 y.o. female ,  seen here as a revisit , following her original referral from  Dr. Doristine Counter for an evaluation of nocturnal onset migraines.  Kathleen Hays reports that her migraines begun around age 74. She has a strong family history that I would like to interject here her mother started having migraine headaches in her 42s as does her daughter. The patient reports that her headaches would wake her up at 3 or 4 AM, and usually they occurred at the time of either ovulation or the first day of her menstrual period. She was not on birth control pills or hormone replacement therapy. Never undergone a hysterectomy. Her son was born in 81, her daughter in 38. Her last period was in July 2015.  She now has hot flushes.   For  several years she  had seen Dr. Neale Burly and Dr. Meryl Crutch, quit smoking, modified her diet and received multiple injection therapies.  She lost weight and has a regular exercise program.  She states she still had the same frequency of migraines but that the severity and intensity was less. There was never any aura, and no olfactory triggers.  At the current time she still has left frontal onset headaches- as she has had all these years they usually start in the middle of the night around 3 AM a headache for last up to 3 days if she can not ' nip"  that. Her headaches however are associated with nausea, photophobia and phonophobia, she is unable to drive during her headaches because she feels very dizzy and vertiginous. She has had nausea to the point of vomiting. currently 1-2 a month, not longer katamenial. Tried Excedrin,  Tylenol, ibuprofen, aspirin and she has used pressure points massage. She finds Excedrin helps her the best if she has an over-the-counter axis, Dr. Doristine Counter recently prescribed Imitrex that 100 mg and if she takes these she is immediately pain-free. She has never used phenergan/ zofran .  Her food triggers are chocolate, coffee, tea, soda. Peanut butter is a trigger.  The patient had a tonsillectomy at age , in 30. Please note that Kathleen Hays has so far not been on a preventative medication,  as she still can lose 2 days a months to headaches and sometimes headaches lasting 3 days in duration: it is the best step at this time to provide a preventative prophylactic medication.  Since she has a low heart rate at baseline and rather low blood pressures I will the stain from using a beta blocker for prophylaxis and instead offer topiramate.   Topamax at 25 mg nightly should be able to control her rather infrequent migrainous headaches. For the acute treatment I would continue to prescribe Imitrex. She does not exceed 2 or 3 a month so the insurance limits for 6 tablets per month will suffice. I have offered Phenergan or Zofran for the treatment of nausea she can use this when necessary she is aware that this is also going to make her very sleepy. Neither Zofran on all Phenergan can be taken if she plans to drive or work that  day.   Interval history from 02/13/2015. [name] begun taking a preventive medication with her last visit and has responded well. Her headaches have decreased in intensity and frequency. If a breakthrough migraine occurs she has found good response with a triptan. She may use a triptan up to once every 6-8 weeks. This is a significant reduction in her headaches and disease burden. She is also not lost any work hours due to headaches. We are here today to refill the medication.  Review of Systems: Out of a complete 14 system review, the patient complains of only the following  symptoms, and all other reviewed systems are negative.      Social History   Social History  . Marital Status: Single    Spouse Name: Kathleen Hays  . Number of Children: 2  . Years of Education: RN   Occupational History  . RN  [employer]   Social History Main Topics  . Smoking status: Former Games developer  . Smokeless tobacco: Never Used     Comment: Quit 2015  . Alcohol Use: No  . Drug Use: No  . Sexual Activity: Not on file   Other Topics Concern  . Not on file   Social History Narrative   Patient lives at home with her husband Kathleen Hays  and daughter.   Patient works at [employer]    [employer].   Right handed.   Caffeine none.    Family History  Problem Relation Age of Onset  . High blood pressure Mother   . Migraines Mother     Past Medical History  Diagnosis Date  . HA (headache)     Past Surgical History  Procedure Laterality Date  . Cesarean section    . Tonsillectomy and adenoidectomy    . Mouth surgery      Current Outpatient Prescriptions  Medication Sig Dispense Refill  . aspirin-acetaminophen-caffeine (EXCEDRIN MIGRAINE) 250-250-65 MG tablet Take 1 tablet by mouth every 6 (six) hours as needed for headache.    . SUMAtriptan (IMITREX) 100 MG tablet Take 1 tablet (100 mg total) by mouth every 2 (two) hours as needed for migraine or headache. May repeat in 2 hours if headache persists or recurs. 10 tablet 5   No current facility-administered medications for this visit.    Allergies as of 02/13/2015 - Review Complete 02/13/2015  Allergen Reaction Noted  . Avelox [moxifloxacin hcl in nacl]  03/14/2014    Vitals: BP 112/78 mmHg  Pulse 62  Resp 20  Ht  (1.651 m)  Wt 151 lb (68.493 kg)  BMI 25.13 kg/m2 Last Weight:  Wt Readings from Last 1 Encounters:  02/13/15 151 lb (68.493 kg)       Last Height:   Ht Readings from Last 1 Encounters:  02/13/15  (1.651 m)    Physical exam:  General: The patient is awake,  alert and appears not in acute distress. The patient is well groomed. Head: Normocephalic, atraumatic. Neck is supple. Mallampati 2   neck circumference:13 . Nasal airflow unrestricted , TMJ is not evident . Retrognathia is not seen.  Cardiovascular:  Regular rate and rhythm  without  murmurs or carotid bruit, and without distended neck veins. Respiratory: Lungs are clear to auscultation. Skin:  Without evidence of edema, or rash Trunk:  normal posture.  Neurologic exam : The patient is awake and alert, oriented to place and time.   Memory subjective described as intact. There is a normal attention span &  concentration ability.  Speech is fluent without dysarthria, dysphonia or aphasia. Mood and affect are appropriate.  Cranial nerves: Pupils are equal and briskly reactive to light. Funduscopic exam without evidence of pallor or edema. Extraocular movements  in vertical and horizontal planes intact and without nystagmus. Visual fields by finger perimetry are intact. Hearing to finger rub intact.  Facial sensation intact to fine touch. Facial motor strength is symmetric and tongue and uvula move midline.  Motor exam:  Normal tone, muscle bulk and symmetric,  strength in all extremities.  Assessment:  After physical and neurologic examination, review of laboratory studies, imaging, neurophysiology testing and pre-existing records, assessment is   1) Ketamenial migraines , now much less frequent as menopause approaches. She is not longer  using prophylactic, preventive daily medication , only sumatriptan for acute migraine attacks, once 6-8 weeks !  She had strong phono- , photophobia and nausea associated with left sided forehead headaches. No cluster and no sinus component.    Plan:  Treatment plan and additional workup : The patient was advised of the nature of the diagnosed  disorder , the treatment options and risks for general a health and wellness arising from not treating the  condition.  Review of headache diary. Visit duration was 15 minutes.  Of this face to face time was 50% spent in discussion and education and answering the patients questions.   Refill Sumatriptan , orally.  The patient experiences a resolution of headaches usually within 30 minutes. She does not complain of incomplete response. She does not have rebounding migraines with in 24 hours. RV with Np in 12 month .    Porfirio Mylar Dohmeier MD  02/13/2015   PCP, [facility] , Report requested to be send to  Marcos Eke, NP

## 2015-02-13 NOTE — Patient Instructions (Signed)
Naproxen; Sumatriptan tablets What is this medicine? NAPROXEN; SUMATRIPTAN (na PROS en; soo ma TRIP tan) is used to treat migraines with or without aura. An aura is a strange feeling or visual disturbance that warns you of an attack. This medicine is not used to prevent migraines. This medicine may be used for other purposes; ask your health care provider or pharmacist if you have questions. What should I tell my health care provider before I take this medicine? They need to know if you have any of these conditions: -angina -diabetes -heart disease -hemiplegic or basilar migraine -high blood pressure -high cholesterol -if you frequently drink alcohol containing drinks -irregular heartbeat -ischemic bowel disease -kidney disease -liver disease -low blood counts, like low white cell, platelet, or red cell counts -lung or breathing disease, like asthma -peripheral vascular disease like intermittent claudication -seizures -stomach problems -stroke -take medicines that treat or prevent blood clots -taken an MAOI like Carbex, Eldepryl, Marplan, Nardil, or Parnate in last 14 days -an unusual or allergic reaction to Naproxen; Sumatriptan, other medicines, foods, dyes, or preservatives -pregnant or trying to get pregnant -breast-feeding How should I use this medicine? Take this medicine by mouth with a glass of water. Follow the directions on the prescription label. You can take it with or without food. If it upsets your stomach, take it with food. Do not cut, crush, or chew this medicine. Do not take it more often than directed. A special MedGuide will be given to you by the pharmacist with each prescription and refill. Be sure to read this information carefully each time. Talk to your pediatrician regarding the use of this medicine in children. While this drug may be prescribed for children as young as 12 for selected conditions, precautions do apply. Overdosage: If you think you have taken  too much of this medicine contact a poison control center or emergency room at once. NOTE: This medicine is only for you. Do not share this medicine with others. What if I miss a dose? This does not apply; this medicine is not for regular use. What may interact with this medicine? What drug(s) may interact with Naproxen; Sumatriptan? Do not take this medicine with any of the following medications: -amphetamine -cidofovir -cocaine -dextroamphetamine -ergot alkaloids like dihydroergotamine, ergonovine, ergotamine, methylergonovine -feverfew -ketorolac -MAOIs like Carbex, Eldepryl, Marplan, Nardil, and Parnate -medicines for migraine headache like almotriptan, eletriptan, frovatriptan, naratriptan, rizatriptan, sumatriptan, zolmitriptan -tryptophan This medicine may also interact with the following medications: -alcohol -angiotensin-converting enzyme inhibitors (ACE inhibitors) like captopril, enalapril, lisinopril, and ramipril -aspirin and aspirin-like medicines -diuretics -duloxetine -lithium -medicines for blood pressure like amlodipine, felodipine, nifedipine -medicines that treat or prevent blood clots like warfarin, enoxaparin, and dalteparin -methotrexate -NSAIDS, medicines for pain and inflammation, like ibuprofen or naproxen -probenecid -selective serotonin reuptake inhibitors such as citalopram, escitalopram, fluoxetine, fluvoxamine, paroxetine, and sertraline -steroid medicines like prednisone or cortisone -venlafaxine This list may not describe all possible interactions. Give your health care provider a list of all the medicines, herbs, non-prescription drugs, or dietary supplements you use. Also tell them if you smoke, drink alcohol, or use illegal drugs. Some items may interact with your medicine. What should I watch for while using this medicine? Only use this medicine for a migraine headache. Use it if you get warning symptoms or at the start of a migraine attack. This  medicine is not for regular use to prevent migraine headaches. Tell your doctor or health care professional if your pain does not get better. Talk to  your doctor before taking another medicine for pain. Do not treat yourself. You may get drowsy or dizzy. Do not drive, use machinery, or do anything that needs mental alertness until you know how this medicine affects you. Do not stand or sit up quickly, especially if you are an older patient. This reduces the risk of dizzy or fainting spells. Alcohol may interfere with the effect of this medicine. Avoid alcoholic drinks. This medicine may increase your risk to bruise or bleed. Call your doctor or health care professional if you notice any unusual bleeding. Do not take other medicines that contain aspirin, ibuprofen, or naproxen with this medicine. Side effects such as stomach upset, nausea, or ulcers may be more likely to occur. Many medicines available without a prescription should not be taken with this medicine. This medicine does not prevent heart attack or stroke. In fact, this medicine may increase the chance of a heart attack or stroke. The chance may increase with longer use of this medicine and in people who have heart disease. If you take aspirin to prevent heart attack or stroke, talk with your doctor or health care professional. This medicine can cause ulcers and bleeding in the stomach and intestines at any time during treatment. Do not smoke cigarettes or drink alcohol. These increase irritation to your stomach and can make it more susceptible to damage from this medicine. Ulcers and bleeding can happen without warning symptoms and can cause death. This medicine can cause you to bleed more easily. Try to avoid damage to your teeth and gums when you brush or floss your teeth. If you have any dental work done, tell your dentist you are receiving this medicine. If you take migraine medicines for 10 or more days a month, your migraines may get worse.  Keep a diary of headache days and medicine use. Contact your healthcare professional if your migraine attacks occur more frequently. What side effects may I notice from receiving this medicine? Side effects that you should report to your doctor or health care professional as soon as possible: -allergic reactions like skin rash, itching or hives, swelling of the face, lips, or tongue -fast, irregular heartbeat -feeling of chest heaviness or pressure -general ill feeling or flu-like symptoms -redness, blistering, peeling or loosening of the skin, including inside the mouth -seizures -signs and symptoms of bleeding such as bloody or black, tarry stools; red or dark-brown urine; spitting up blood or brown material that looks like coffee grounds; red spots on the skin; unusual bruising or bleeding from the eye, gums, or nose -signs and symptoms of a blood clot such as breathing problems; changes in vision; chest pain; severe, sudden headache; pain, swelling, warmth in the leg; trouble speaking; sudden numbness or weakness of the face, arm or leg -swelling of the ankles, feet, hands -severe stomach pain -tingling, pain, or numbness in the face, hands or feet -unusually weak or tired -yellowing of eyes or skin Side effects that usually do not require medical attention (report to your doctor or health care professional if they continue or are bothersome): -dry mouth -headache -heartburn -nausea, vomiting This list may not describe all possible side effects. Call your doctor for medical advice about side effects. You may report side effects to FDA at 1-800-FDA-1088. Where should I keep my medicine? Keep out of the reach of children. Store at room temperature between 15 and 30 degrees C (59 and 86 degrees F). Throw away any unused medicine after the expiration date. NOTE: This sheet  is a summary. It may not cover all possible information. If you have questions about this medicine, talk to your doctor,  pharmacist, or health care provider.    2016, Elsevier/[employer] Standard. (2013-06-14 14:55:46)

## 2016-01-23 ENCOUNTER — Ambulatory Visit (INDEPENDENT_AMBULATORY_CARE_PROVIDER_SITE_OTHER): Payer: 59 | Admitting: Nurse Practitioner

## 2016-01-23 ENCOUNTER — Other Ambulatory Visit (INDEPENDENT_AMBULATORY_CARE_PROVIDER_SITE_OTHER): Payer: 59

## 2016-01-23 ENCOUNTER — Encounter: Payer: Self-pay | Admitting: Nurse Practitioner

## 2016-01-23 VITALS — BP 104/70 | HR 51 | Temp 97.8°F | Ht 65.0 in | Wt 149.5 lb

## 2016-01-23 DIAGNOSIS — Z8659 Personal history of other mental and behavioral disorders: Secondary | ICD-10-CM | POA: Diagnosis not present

## 2016-01-23 DIAGNOSIS — Z780 Asymptomatic menopausal state: Secondary | ICD-10-CM

## 2016-01-23 DIAGNOSIS — R51 Headache: Secondary | ICD-10-CM

## 2016-01-23 DIAGNOSIS — F3342 Major depressive disorder, recurrent, in full remission: Secondary | ICD-10-CM | POA: Diagnosis not present

## 2016-01-23 DIAGNOSIS — Z86010 Personal history of colonic polyps: Secondary | ICD-10-CM

## 2016-01-23 DIAGNOSIS — R519 Headache, unspecified: Secondary | ICD-10-CM

## 2016-01-23 DIAGNOSIS — Z0000 Encounter for general adult medical examination without abnormal findings: Secondary | ICD-10-CM

## 2016-01-23 DIAGNOSIS — F329 Major depressive disorder, single episode, unspecified: Secondary | ICD-10-CM | POA: Insufficient documentation

## 2016-01-23 DIAGNOSIS — F32A Depression, unspecified: Secondary | ICD-10-CM | POA: Insufficient documentation

## 2016-01-23 DIAGNOSIS — E782 Mixed hyperlipidemia: Secondary | ICD-10-CM

## 2016-01-23 LAB — COMPREHENSIVE METABOLIC PANEL
ALT: 16 U/L (ref 0–35)
AST: 15 U/L (ref 0–37)
Albumin: 4.5 g/dL (ref 3.5–5.2)
Alkaline Phosphatase: 35 U/L — ABNORMAL LOW (ref 39–117)
BUN: 15 mg/dL (ref 6–23)
CO2: 29 mEq/L (ref 19–32)
Calcium: 9.4 mg/dL (ref 8.4–10.5)
Chloride: 104 mEq/L (ref 96–112)
Creatinine, Ser: 0.77 mg/dL (ref 0.40–1.20)
GFR: 83.65 mL/min (ref >60.00)
Glucose, Bld: 97 mg/dL (ref 70–99)
Potassium: 3.9 mEq/L (ref 3.5–5.1)
Sodium: 142 mEq/L (ref 135–145)
Total Bilirubin: 0.5 mg/dL (ref 0.2–1.2)
Total Protein: 6.8 g/dL (ref 6.0–8.3)

## 2016-01-23 LAB — CBC WITH DIFFERENTIAL/PLATELET
Basophils Absolute: 0 10*3/uL (ref 0.0–0.1)
Basophils Relative: 0.4 % (ref 0.0–3.0)
Eosinophils Absolute: 0.1 10*3/uL (ref 0.0–0.7)
Eosinophils Relative: 2.3 % (ref 0.0–5.0)
HCT: 44.1 % (ref 36.0–46.0)
Hemoglobin: 15.1 g/dL — ABNORMAL HIGH (ref 12.0–15.0)
Lymphocytes Relative: 28.4 % (ref 12.0–46.0)
Lymphs Abs: 1.8 10*3/uL (ref 0.7–4.0)
MCHC: 34.3 g/dL (ref 30.0–36.0)
MCV: 89.9 fl (ref 78.0–100.0)
Monocytes Absolute: 0.2 10*3/uL (ref 0.1–1.0)
Monocytes Relative: 3.7 % (ref 3.0–12.0)
Neutro Abs: 4.2 10*3/uL (ref 1.4–7.7)
Neutrophils Relative %: 65.2 % (ref 43.0–77.0)
Platelets: 201 10*3/uL (ref 150.0–400.0)
RBC: 4.91 Mil/uL (ref 3.87–5.11)
RDW: 13.2 % (ref 11.5–15.5)
WBC: 6.5 10*3/uL (ref 4.0–10.5)

## 2016-01-23 LAB — LIPID PANEL
Cholesterol: 223 mg/dL — ABNORMAL HIGH (ref 0–200)
HDL: 83.6 mg/dL (ref >39.00)
LDL Cholesterol: 127 mg/dL — ABNORMAL HIGH (ref 0–99)
NonHDL: 139.27
Total CHOL/HDL Ratio: 3
Triglycerides: 61 mg/dL (ref 0.0–149.0)
VLDL: 12.2 mg/dL (ref 0.0–40.0)

## 2016-01-23 LAB — TSH: TSH: 0.77 u[IU]/mL (ref 0.35–4.50)
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
## 2016-01-23 NOTE — Assessment & Plan Note (Signed)
In remission. Medication at this time. Unable to remember what was used in past.

## 2016-01-23 NOTE — Assessment & Plan Note (Signed)
Controlled with diet and oral hydration

## 2016-01-23 NOTE — Progress Notes (Signed)
Pre visit review using our clinic review tool, if applicable. No additional management support is needed unless otherwise documented below in the visit note. 

## 2016-01-23 NOTE — Patient Instructions (Addendum)
Decline use of flonase or mucinex for sinus congestion. Opted to use saline nasal rinse only.  Encourage use of heart healthy diet and exercise to lower cholesterol. Repeat lipid panel in 6months.
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
## 2016-01-24 LAB — HIV ANTIBODY (ROUTINE TESTING W REFLEX): HIV 1&2 Ab, 4th Generation: NONREACTIVE

## 2016-01-24 LAB — HEPATITIS C ANTIBODY: HCV Ab: NEGATIVE

## 2016-01-25 LAB — VITAMIN D 1,25 DIHYDROXY
Vitamin D 1, 25 (OH)2 Total: 55 pg/mL (ref 18–72)
Vitamin D2 1, 25 (OH)2: <8 pg/mL
Vitamin D3 1, 25 (OH)2: 55 pg/mL

## 2016-01-26 ENCOUNTER — Encounter: Payer: Self-pay | Admitting: Nurse Practitioner

## 2016-02-05 ENCOUNTER — Ambulatory Visit (INDEPENDENT_AMBULATORY_CARE_PROVIDER_SITE_OTHER)
Admission: RE | Admit: 2016-02-05 | Discharge: 2016-02-05 | Disposition: A | Discharge status: Home / Self Care | Payer: 59 | Source: Ambulatory Visit | Attending: Nurse Practitioner | Admitting: Nurse Practitioner

## 2016-02-05 DIAGNOSIS — Z780 Asymptomatic menopausal state: Secondary | ICD-10-CM | POA: Diagnosis not present

## 2016-02-05 DIAGNOSIS — Z8659 Personal history of other mental and behavioral disorders: Secondary | ICD-10-CM

## 2016-02-16 ENCOUNTER — Ambulatory Visit (INDEPENDENT_AMBULATORY_CARE_PROVIDER_SITE_OTHER): Payer: 59 | Admitting: Adult Health

## 2016-02-16 ENCOUNTER — Encounter: Payer: Self-pay | Admitting: Adult Health

## 2016-02-16 VITALS — BP 102/56 | HR 63 | Resp 16 | Ht 65.0 in | Wt 153.0 lb

## 2016-02-16 DIAGNOSIS — G43009 Migraine without aura, not intractable, without status migrainosus: Secondary | ICD-10-CM
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
## 2016-02-16 NOTE — Progress Notes (Signed)
I agree with the assessment and plan as directed by NP .The patient is known to me .   DOHMEIER,CARMEN, MD  

## 2016-02-16 NOTE — Patient Instructions (Signed)
Continue Imitrex If your symptoms worsen or you develop new symptoms please let us know.   

## 2016-02-18 ENCOUNTER — Telehealth: Payer: Self-pay | Admitting: Adult Health

## 2016-02-18 DIAGNOSIS — G43821 Menstrual migraine, not intractable, with status migrainosus: Secondary | ICD-10-CM

## 2016-02-18 NOTE — Telephone Encounter (Signed)
Patient requesting refill of SUMAtriptan (IMITREX) 100 MG tablet called to [facility] on [address].

## 2016-02-19 MED ORDER — SUMATRIPTAN SUCCINATE 100 MG PO TABS: ORAL_TABLET | ORAL | 5 refills | Status: DC

## 2016-02-19 NOTE — Telephone Encounter (Signed)
Spoke to pt and let her know, she verbalized understanding.

## 2016-02-19 NOTE — Telephone Encounter (Signed)
Done

## 2016-02-19 NOTE — Addendum Note (Signed)
Addended byHermenia Fiscal: YOUNG, SANDRA on: 02/19/2016 10:00 AM   Modules accepted: Orders

## 2016-05-03 ENCOUNTER — Ambulatory Visit (INDEPENDENT_AMBULATORY_CARE_PROVIDER_SITE_OTHER): Payer: 59 | Admitting: Nurse Practitioner

## 2016-05-03 ENCOUNTER — Encounter: Payer: Self-pay | Admitting: Nurse Practitioner

## 2016-05-03 VITALS — BP 100/68 | HR 78 | Temp 98.2°F | Wt 148.0 lb

## 2016-05-03 DIAGNOSIS — R300 Dysuria: Secondary | ICD-10-CM | POA: Diagnosis not present

## 2016-05-03 LAB — POCT URINALYSIS DIPSTICK
Bilirubin, UA: NEGATIVE
Glucose, UA: NEGATIVE
Ketones, UA: NEGATIVE
Nitrite, UA: NEGATIVE
Protein, UA: NEGATIVE
Spec Grav, UA: 1.015 (ref 1.030–1.035)
Urobilinogen, UA: 0.2 (ref ?–2.0)
pH, UA: 6 (ref 5.0–8.0)

## 2016-05-03 MED ORDER — NITROFURANTOIN MONOHYD MACRO 100 MG PO CAPS: 100.0000 mg | ORAL_CAPSULE | Freq: Two times a day (BID) | ORAL | 0 refills | Status: AC

## 2016-05-03 MED ORDER — PHENAZOPYRIDINE HCL 100 MG PO TABS: 100.0000 mg | ORAL_TABLET | Freq: Three times a day (TID) | ORAL | 0 refills | Status: DC | PRN

## 2016-05-03 NOTE — Patient Instructions (Signed)

## 2016-05-03 NOTE — Progress Notes (Signed)
Subjective:  Patient ID: Kathleen Hays, female    DOB: 1966/01/14  Age: 51 y.o. MRN: [id number]  CC: Follow-up (pridium is not covering by her insurance. please advise. )   Dysuria   This is a new problem. The current episode started yesterday. The problem occurs every urination. The problem has been unchanged. The quality of the pain is described as burning. There has been no fever. She is sexually active. There is no history of pyelonephritis. Associated symptoms include chills, frequency, hematuria and urgency. Pertinent negatives include no discharge, flank pain, hesitancy, nausea, possible pregnancy, sweats or vomiting. She has tried increased fluids and NSAIDs for the symptoms. The treatment provided mild relief. There is no history of catheterization, kidney stones, recurrent UTIs, a single kidney, urinary stasis or a urological procedure.    Outpatient Medications Prior to Visit  Medication Sig Dispense Refill  . aspirin-acetaminophen-caffeine (EXCEDRIN MIGRAINE) 250-250-65 MG tablet Take 1 tablet by mouth every 6 (six) hours as needed for headache.    . Misc Natural Products (TART CHERRY ADVANCED PO) Take 2 capsules by mouth daily.    . Multiple Vitamin (MULTIVITAMIN) tablet Take 1 tablet by mouth daily.    . SUMAtriptan (IMITREX) 100 MG tablet Take one tablet onset of migraine and may repeat in 2 hours if headache persists or recurs. (2 tabs max in 24 hours) 10 tablet 5   No facility-administered medications prior to visit.     ROS See HPI  Objective:  LMP 01/25/2013 (Approximate)   BP Readings from Last 3 Encounters:  02/16/16 (!) 102/56  01/23/16 104/70  02/13/15 112/78    Wt Readings from Last 3 Encounters:  02/16/16 153 lb (69.4 kg)  01/23/16 149 lb 8 oz (67.8 kg)  02/13/15 151 lb (68.5 kg)    Physical Exam  Constitutional: She is oriented to person, place, and time. No distress.  Cardiovascular: Normal rate.   Pulmonary/Chest: Effort normal.  Abdominal:  Soft. She exhibits no distension. There is tenderness.  Neurological: She is alert and oriented to person, place, and time.  Vitals reviewed.   Lab Results  Component Value Date   WBC 6.5 01/23/2016   HGB 15.1 (H) 01/23/2016   HCT 44.1 01/23/2016   PLT 201.0 01/23/2016   GLUCOSE 97 01/23/2016   CHOL 223 (H) 01/23/2016   TRIG 61.0 01/23/2016   HDL 83.60 01/23/2016   LDLCALC 127 (H) 01/23/2016   ALT 16 01/23/2016   AST 15 01/23/2016   NA 142 01/23/2016   K 3.9 01/23/2016   CL 104 01/23/2016   CREATININE 0.77 01/23/2016   BUN 15 01/23/2016   CO2 29 01/23/2016   TSH 0.77 01/23/2016    Dg Bone Density  Result Date: 02/09/2016 Date of study: 02/05/16 Exam: DUAL X-RAY ABSORPTIOMETRY (DXA) FOR BONE MINERAL DENSITY (BMD) Instrument: [employer] Therapist, art Provider: PCP Indication: follow up for low BMD Comparison: none (please note that it is not possible to compare data from different instruments) Clinical data: Pt is a 51 y.o. female without  previous history of fracture. On calcium and vitamin D. Results:  Lumbar spine (L1-L4) Femoral neck (FN) 33% distal radius T-score -0.8 RFN:-1.2 LFN:-1.2 n/a Change in BMD from previous DXA test (%) n/a n/a n/a (*) statistically significant Assessment: the BMD is low according to the [facility] classification for osteoporosis (see below). Fracture risk: moderate FRAX score: 10 year major osteoporotic risk: 5.0%. 10 year hip fracture risk: 0.3%. These are under the thresholds for treatment of 20%  and 3%, respectively. Comments: the technical quality of the study is good. Evaluation for secondary causes should be considered if clinically indicated. Recommend optimizing calcium (1200 mg/day) and vitamin D (800 IU/day) intake. Followup: Repeat BMD is appropriate after 2 years or after 1-2 years if starting treatment. WHO criteria for diagnosis of osteoporosis in postmenopausal women and in men 53 y/o or older: - normal: T-score -1.0 to + 1.0 - osteopenia/low  bone density: T-score between -2.5 and -1.0 - osteoporosis: T-score below -2.5 - severe osteoporosis: T-score below -2.5 with history of fragility fracture Note: although not part of the WHO classification, the presence of a fragility fracture, regardless of the T-score, should be considered diagnostic of osteoporosis, provided other causes for the fracture have been excluded. Treatment: The National Osteoporosis Foundation recommends that treatment be considered in postmenopausal women and men age 53 or older with: 1. Hip or vertebral (clinical or morphometric) fracture 2. T-score of - 2.5 or lower at the spine or hip 3. 10-year fracture probability by FRAX of at least 20% for a major osteoporotic fracture and 3% for a hip fracture Roxy Manns MD    Assessment & Plan:   Kathleen Hays was seen today for follow-up.  Diagnoses and all orders for this visit:  Dysuria -     POCT urinalysis dipstick -     nitrofurantoin, macrocrystal-monohydrate, (MACROBID) 100 MG capsule; Take 1 capsule (100 mg total) by mouth 2 (two) times daily. -     phenazopyridine (PYRIDIUM) 100 MG tablet; Take 1 tablet (100 mg total) by mouth 3 (three) times daily as needed for pain (with food).   I am having Kathleen Hays start on nitrofurantoin (macrocrystal-monohydrate) and phenazopyridine. I am also having her maintain her aspirin-acetaminophen-caffeine, multivitamin, Misc Natural Products (TART CHERRY ADVANCED PO), and SUMAtriptan.  Meds ordered this encounter  Medications  . nitrofurantoin, macrocrystal-monohydrate, (MACROBID) 100 MG capsule    Sig: Take 1 capsule (100 mg total) by mouth 2 (two) times daily.    Dispense:  14 capsule    Refill:  0    Order Specific Question:   Supervising Provider    Answer:   Tresa Garter [[id number]]  . phenazopyridine (PYRIDIUM) 100 MG tablet    Sig: Take 1 tablet (100 mg total) by mouth 3 (three) times daily as needed for pain (with food).    Dispense:  10 tablet    Refill:  0     Order Specific Question:   Supervising Provider    Answer:   Tresa Garter [[id number]]    Follow-up: Return if symptoms worsen or fail to improve.  Kathleen Penna, NP

## 2016-09-23 ENCOUNTER — Other Ambulatory Visit (INDEPENDENT_AMBULATORY_CARE_PROVIDER_SITE_OTHER): Payer: 59

## 2016-09-23 ENCOUNTER — Telehealth: Payer: Self-pay

## 2016-09-23 ENCOUNTER — Other Ambulatory Visit: Payer: 59

## 2016-09-23 DIAGNOSIS — R300 Dysuria: Secondary | ICD-10-CM

## 2016-09-23 DIAGNOSIS — R319 Hematuria, unspecified: Secondary | ICD-10-CM

## 2016-09-23 LAB — POCT URINALYSIS DIPSTICK
Bilirubin, UA: NEGATIVE
Glucose, UA: NEGATIVE
Ketones, UA: NEGATIVE
Nitrite, UA: NEGATIVE
Protein, UA: 15
Spec Grav, UA: 1.015 (ref 1.010–1.025)
Urobilinogen, UA: 0.2 E.U./dL
pH, UA: 6 (ref 5.0–8.0)

## 2016-09-23 MED ORDER — CEPHALEXIN 500 MG PO CAPS: 500.0000 mg | ORAL_CAPSULE | Freq: Three times a day (TID) | ORAL | 0 refills | Status: DC

## 2016-09-23 MED ORDER — PHENAZOPYRIDINE HCL 100 MG PO TABS: 100.0000 mg | ORAL_TABLET | Freq: Three times a day (TID) | ORAL | 0 refills | Status: DC | PRN

## 2016-09-23 NOTE — Addendum Note (Signed)
Addended by: Alysia PennaNCHE, CHARLOTTE L on: 09/23/2016 02:28 PM   Modules accepted: Orders

## 2016-09-23 NOTE — Telephone Encounter (Signed)
Oral abx sent 

## 2016-09-23 NOTE — Telephone Encounter (Signed)
Patient notified

## 2016-09-23 NOTE — Progress Notes (Signed)
Consider CT to rule out kidney stone if urine culture negative

## 2016-09-23 NOTE — Telephone Encounter (Signed)
I have entered the results for urinalysis and ordered urine culture. Please advise

## 2016-09-26 LAB — URINE CULTURE

## 2016-09-26 MED ORDER — NITROFURANTOIN MONOHYD MACRO 100 MG PO CAPS: 100.0000 mg | ORAL_CAPSULE | Freq: Two times a day (BID) | ORAL | 0 refills | Status: DC

## 2016-12-06 ENCOUNTER — Other Ambulatory Visit: Payer: Self-pay | Admitting: Adult Health

## 2016-12-06 DIAGNOSIS — G43821 Menstrual migraine, not intractable, with status migrainosus: Secondary | ICD-10-CM

## 2017-02-08 ENCOUNTER — Telehealth (INDEPENDENT_AMBULATORY_CARE_PROVIDER_SITE_OTHER): Payer: 59

## 2017-02-08 ENCOUNTER — Other Ambulatory Visit: Payer: Self-pay | Admitting: Internal Medicine

## 2017-02-08 ENCOUNTER — Other Ambulatory Visit: Payer: 59

## 2017-02-08 DIAGNOSIS — R8290 Unspecified abnormal findings in urine: Secondary | ICD-10-CM | POA: Diagnosis not present

## 2017-02-08 DIAGNOSIS — R3915 Urgency of urination: Secondary | ICD-10-CM

## 2017-02-08 DIAGNOSIS — R300 Dysuria: Secondary | ICD-10-CM

## 2017-02-08 LAB — POC URINALSYSI DIPSTICK (AUTOMATED)
Bilirubin, UA: NEGATIVE
Blood, UA: POSITIVE
Glucose, UA: NEGATIVE
Ketones, UA: NEGATIVE
Nitrite, UA: NEGATIVE
Protein, UA: NEGATIVE
Spec Grav, UA: 1.015 (ref 1.010–1.025)
Urobilinogen, UA: 0.2 E.U./dL
pH, UA: 6 (ref 5.0–8.0)

## 2017-02-08 MED ORDER — NITROFURANTOIN MONOHYD MACRO 100 MG PO CAPS: 100.0000 mg | ORAL_CAPSULE | Freq: Two times a day (BID) | ORAL | 0 refills | Status: DC

## 2017-02-08 NOTE — Telephone Encounter (Signed)
Pt c/o of urinary urgency. Per PCP okay to enter UA and culture.

## 2017-02-10 LAB — CULTURE, URINE COMPREHENSIVE
MICRO NUMBER:: 90060074
SPECIMEN QUALITY:: ADEQUATE

## 2017-02-16 ENCOUNTER — Ambulatory Visit: Payer: 59 | Admitting: Adult Health

## 2017-03-01 ENCOUNTER — Encounter: Payer: Self-pay | Admitting: Internal Medicine

## 2017-03-01 NOTE — Progress Notes (Signed)
Subjective:    Patient ID: Kathleen Hays, female    DOB: 11/25/1965, 52 y.o.   MRN: [id number]  HPI She is here for a physical exam.   She has no concerns.    Medications and allergies reviewed with patient and updated if appropriate.  Patient Active Problem List   Diagnosis Date Noted  . Family history of pernicious anemia 03/02/2017  . Hx of anorexia nervosa 01/23/2016  . History of colonic polyps 01/23/2016  . Migraines     Current Outpatient Medications on File Prior to Visit  Medication Sig Dispense Refill  . aspirin-acetaminophen-caffeine (EXCEDRIN MIGRAINE) 250-250-65 MG tablet Take 1 tablet by mouth every 6 (six) hours as needed for headache.    . Misc Natural Products (TART CHERRY ADVANCED PO) Take 2 capsules by mouth daily.    . Multiple Vitamin (MULTIVITAMIN) tablet Take 1 tablet by mouth daily.    . SUMAtriptan (IMITREX) 100 MG tablet TAKE 1 TABLET AT ONSET OF HEADACHE AND MAY REPEAT IN 2 HOURS IF PERSISTS OR RECURS MAX 2TABS/24HRS 10 tablet 5   No current facility-administered medications on file prior to visit.     Past Medical History:  Diagnosis Date  . Depression    PTSD secondary to witnessed father suicide (gunshot to chest)  . HA (headache)     Past Surgical History:  Procedure Laterality Date  . CESAREAN SECTION    . MOUTH SURGERY    . TONSILLECTOMY AND ADENOIDECTOMY    . WISDOM TOOTH EXTRACTION Bilateral 1984   Patient was [age] years old.     Social History   Socioeconomic History  . Marital status: Married    Spouse name: Aurther Lofterry  . Number of children: 2  . Years of education: Charity fundraiserN  . Highest education level: None  Social Needs  . Financial resource strain: None  . Food insecurity - worry: None  . Food insecurity - inability: None  . Transportation needs - medical: None  . Transportation needs - non-medical: None  Occupational History  . Occupation: Engineering geologistN     Employer: [employer]  Tobacco Use  . Smoking status: Former Smoker   Types: Cigarettes    Last attempt to quit: 2010    Years since quitting: 9.1  . Smokeless tobacco: Never Used  . Tobacco comment: Quit 2015  Substance and Sexual Activity  . Alcohol use: No    Alcohol/week: 0.0 oz  . Drug use: No  . Sexual activity: Yes    Partners: Male    Birth control/protection: Other-see comments, Post-menopausal    Comment: Without menstral cycle for 3 year on January 2018  Other Topics Concern  . None  Social History Narrative   Patient lives at home with her husband Aurther Lofterry  and daughter.   Patient works at [employer] and [employer].    [employer] RN.   Right handed.   Caffeine none.    Family History  Problem Relation Age of Onset  . High blood pressure Mother   . Migraines Mother   . Thyroid disease Mother   . Depression Father   . Suicidality Father        gunshot  . Diabetes Maternal Grandfather   . Diabetes Paternal Grandmother     Review of Systems  Constitutional: Negative for chills and fever.  HENT: Positive for congestion.   Eyes: Negative for visual disturbance.  Respiratory: Negative for cough, shortness of breath and wheezing.   Cardiovascular: Negative for chest pain, palpitations  and leg swelling.  Gastrointestinal: Negative for abdominal pain, blood in stool, constipation, diarrhea and nausea.       No gerd  Genitourinary: Negative for dysuria and hematuria.  Musculoskeletal: Negative for arthralgias, back pain and myalgias.  Skin: Positive for rash (eczema). Negative for color change.  Neurological: Positive for headaches (migraines 1 every 2 months). Negative for dizziness and light-headedness.  Psychiatric/Behavioral: Negative for dysphoric mood and sleep disturbance. The patient is not nervous/anxious.        Objective:   Vitals:   03/02/17 0744  BP: 100/62  Pulse: (!) 57  Resp: 16  Temp: 98 F (36.7 C)  SpO2: 97%   Filed Weights   03/02/17 0744  Weight: 134 lb (60.8 kg)   Body mass index is 22.3  kg/m.  Wt Readings from Last 3 Encounters:  03/02/17 134 lb (60.8 kg)  05/03/16 148 lb (67.1 kg)  02/16/16 153 lb (69.4 kg)     Physical Exam Constitutional: She appears well-developed and well-nourished. No distress.  HENT:  Head: Normocephalic and atraumatic.  Right Ear: External ear normal. Normal ear canal and TM Left Ear: External ear normal.  Normal ear canal and TM Mouth/Throat: Oropharynx is clear and moist.  Eyes: Conjunctivae and EOM are normal.  Neck: Neck supple. No tracheal deviation present. No thyromegaly present.  No carotid bruit  Cardiovascular: Normal rate, regular rhythm and normal heart sounds.   No murmur heard.  No edema. Pulmonary/Chest: Effort normal and breath sounds normal. No respiratory distress. She has no wheezes. She has no rales.  Breast: deferred  Abdominal: Soft. She exhibits no distension. There is no tenderness.  Lymphadenopathy: She has no cervical adenopathy.  Skin: Skin is warm and dry. She is not diaphoretic.  Psychiatric: She has a normal mood and affect. Her behavior is normal.        Assessment & Plan:   Physical exam: Screening blood work  ordered Immunizations  Td, flu up to date, reviewed shingrix Colonoscopy  Will look into cologuard Mammogram  deferred Gyn - deferred Eye exams   Up to date  Exercise  Very active at work - walking a lot Weight  Normal BMI Skin   No concerns - has some eczema - managed with lotions Substance abuse  none  See Problem List for Assessment and Plan of chronic medical problems.

## 2017-03-01 NOTE — Patient Instructions (Addendum)
Test(s) ordered today. Your results will be released to [facility] (or called to you) after review, usually within 72hours after test completion. If any changes need to be made, you will be notified at that same time.  All other Health Maintenance issues reviewed.   All recommended immunizations and age-appropriate screenings are up-to-date or discussed.  No immunizations administered today.   Medications reviewed and updated.  No changes recommended at this time.   Please followup in one year   Health Maintenance, Female Adopting a healthy lifestyle and getting preventive care can go a long way to promote health and wellness. Talk with your health care provider about what schedule of regular examinations is right for you. This is a good chance for you to check in with your provider about disease prevention and staying healthy. In between checkups, there are plenty of things you can do on your own. Experts have done a lot of research about which lifestyle changes and preventive measures are most likely to keep you healthy. Ask your health care provider for more information. Weight and diet Eat a healthy diet  Be sure to include plenty of vegetables, fruits, low-fat dairy products, and lean protein.  Do not eat a lot of foods high in solid fats, added sugars, or salt.  Get regular exercise. This is one of the most important things you can do for your health. ? Most adults should exercise for at least 150 minutes each week. The exercise should increase your heart rate and make you sweat (moderate-intensity exercise). ? Most adults should also do strengthening exercises at least twice a week. This is in addition to the moderate-intensity exercise.  Maintain a healthy weight  Body mass index (BMI) is a measurement that can be used to identify possible weight problems. It estimates body fat based on height and weight. Your health care provider can help determine your BMI and help you achieve or  maintain a healthy weight.  For females 64 years of age and older: ? A BMI below 18.5 is considered underweight. ? A BMI of 18.5 to 24.9 is normal. ? A BMI of 25 to 29.9 is considered overweight. ? A BMI of 30 and above is considered obese.  Watch levels of cholesterol and blood lipids  You should start having your blood tested for lipids and cholesterol at 54 years of age, then have this test every 5 years.  You may need to have your cholesterol levels checked more often if: ? Your lipid or cholesterol levels are high. ? You are older than 54 years of age. ? You are at high risk for heart disease.  Cancer screening Lung Cancer  Lung cancer screening is recommended for adults 72-7 years old who are at high risk for lung cancer because of a history of smoking.  A yearly low-dose CT scan of the lungs is recommended for people who: ? Currently smoke. ? Have quit within the past 15 years. ? Have at least a 30-pack-year history of smoking. A pack year is smoking an average of one pack of cigarettes a day for 1 year.  Yearly screening should continue until it has been 15 years since you quit.  Yearly screening should stop if you develop a health problem that would prevent you from having lung cancer treatment.  Breast Cancer  Practice breast self-awareness. This means understanding how your breasts normally appear and feel.  It also means doing regular breast self-exams. Let your health care provider know about any changes,  no matter how small.  If you are in your 20s or 30s, you should have a clinical breast exam (CBE) by a health care provider every 1-3 years as part of a regular health exam.  If you are 40 or older, have a CBE every year. Also consider having a breast X-ray (mammogram) every year.  If you have a family history of breast cancer, talk to your health care provider about genetic screening.  If you are at high risk for breast cancer, talk to your health care  provider about having an MRI and a mammogram every year.  Breast cancer gene (BRCA) assessment is recommended for women who have family members with BRCA-related cancers. BRCA-related cancers include: ? Breast. ? Ovarian. ? Tubal. ? Peritoneal cancers.  Results of the assessment will determine the need for genetic counseling and BRCA1 and BRCA2 testing.  Cervical Cancer Your health care provider may recommend that you be screened regularly for cancer of the pelvic organs (ovaries, uterus, and vagina). This screening involves a pelvic examination, including checking for microscopic changes to the surface of your cervix (Pap test). You may be encouraged to have this screening done every 3 years, beginning at age 21.  For women ages 30-65, health care providers may recommend pelvic exams and Pap testing every 3 years, or they may recommend the Pap and pelvic exam, combined with testing for human papilloma virus (HPV), every 5 years. Some types of HPV increase your risk of cervical cancer. Testing for HPV may also be done on women of any age with unclear Pap test results.  Other health care providers may not recommend any screening for nonpregnant women who are considered low risk for pelvic cancer and who do not have symptoms. Ask your health care provider if a screening pelvic exam is right for you.  If you have had past treatment for cervical cancer or a condition that could lead to cancer, you need Pap tests and screening for cancer for at least 20 years after your treatment. If Pap tests have been discontinued, your risk factors (such as having a new sexual partner) need to be reassessed to determine if screening should resume. Some women have medical problems that increase the chance of getting cervical cancer. In these cases, your health care provider may recommend more frequent screening and Pap tests.  Colorectal Cancer  This type of cancer can be detected and often prevented.  Routine  colorectal cancer screening usually begins at 54 years of age and continues through 54 years of age.  Your health care provider may recommend screening at an earlier age if you have risk factors for colon cancer.  Your health care provider may also recommend using home test kits to check for hidden blood in the stool.  A small camera at the end of a tube can be used to examine your colon directly (sigmoidoscopy or colonoscopy). This is done to check for the earliest forms of colorectal cancer.  Routine screening usually begins at age 50.  Direct examination of the colon should be repeated every 5-10 years through 54 years of age. However, you may need to be screened more often if early forms of precancerous polyps or small growths are found.  Skin Cancer  Check your skin from head to toe regularly.  Tell your health care provider about any new moles or changes in moles, especially if there is a change in a mole's shape or color.  Also tell your health care provider if you   have a mole that is larger than the size of a pencil eraser.  Always use sunscreen. Apply sunscreen liberally and repeatedly throughout the day.  Protect yourself by wearing long sleeves, pants, a wide-brimmed hat, and sunglasses whenever you are outside.  Heart disease, diabetes, and high blood pressure  High blood pressure causes heart disease and increases the risk of stroke. High blood pressure is more likely to develop in: ? People who have blood pressure in the high end of the normal range (130-139/85-89 mm Hg). ? People who are overweight or obese. ? People who are African American.  If you are 24-25 years of age, have your blood pressure checked every 3-5 years. If you are 2 years of age or older, have your blood pressure checked every year. You should have your blood pressure measured twice-once when you are at a hospital or clinic, and once when you are not at a hospital or clinic. Record the average of the  two measurements. To check your blood pressure when you are not at a hospital or clinic, you can use: ? An automated blood pressure machine at a pharmacy. ? A home blood pressure monitor.  If you are between 42 years and 59 years old, ask your health care provider if you should take aspirin to prevent strokes.  Have regular diabetes screenings. This involves taking a blood sample to check your fasting blood sugar level. ? If you are at a normal weight and have a low risk for diabetes, have this test once every three years after 54 years of age. ? If you are overweight and have a high risk for diabetes, consider being tested at a younger age or more often. Preventing infection Hepatitis B  If you have a higher risk for hepatitis B, you should be screened for this virus. You are considered at high risk for hepatitis B if: ? You were born in a country where hepatitis B is common. Ask your health care provider which countries are considered high risk. ? Your parents were born in a high-risk country, and you have not been immunized against hepatitis B (hepatitis B vaccine). ? You have HIV or AIDS. ? You use needles to inject street drugs. ? You live with someone who has hepatitis B. ? You have had sex with someone who has hepatitis B. ? You get hemodialysis treatment. ? You take certain medicines for conditions, including cancer, organ transplantation, and autoimmune conditions.  Hepatitis C  Blood testing is recommended for: ? Everyone born from [birth year] through 1967. ? Anyone with known risk factors for hepatitis C.  Sexually transmitted infections (STIs)  You should be screened for sexually transmitted infections (STIs) including gonorrhea and chlamydia if: ? You are sexually active and are younger than 54 years of age. ? You are older than 54 years of age and your health care provider tells you that you are at risk for this type of infection. ? Your sexual activity has changed since you  were last screened and you are at an increased risk for chlamydia or gonorrhea. Ask your health care provider if you are at risk.  If you do not have HIV, but are at risk, it may be recommended that you take a prescription medicine daily to prevent HIV infection. This is called pre-exposure prophylaxis (PrEP). You are considered at risk if: ? You are sexually active and do not regularly use condoms or know the HIV status of your partner(s). ? You take drugs by injection. ?  You are sexually active with a partner who has HIV.  Talk with your health care provider about whether you are at high risk of being infected with HIV. If you choose to begin PrEP, you should first be tested for HIV. You should then be tested every 3 months for as long as you are taking PrEP. Pregnancy  If you are premenopausal and you may become pregnant, ask your health care provider about preconception counseling.  If you may become pregnant, take 400 to 800 micrograms (mcg) of folic acid every day.  If you want to prevent pregnancy, talk to your health care provider about birth control (contraception). Osteoporosis and menopause  Osteoporosis is a disease in which the bones lose minerals and strength with aging. This can result in serious bone fractures. Your risk for osteoporosis can be identified using a bone density scan.  If you are 65 years of age or older, or if you are at risk for osteoporosis and fractures, ask your health care provider if you should be screened.  Ask your health care provider whether you should take a calcium or vitamin D supplement to lower your risk for osteoporosis.  Menopause may have certain physical symptoms and risks.  Hormone replacement therapy may reduce some of these symptoms and risks. Talk to your health care provider about whether hormone replacement therapy is right for you. Follow these instructions at home:  Schedule regular health, dental, and eye exams.  Stay current  with your immunizations.  Do not use any tobacco products including cigarettes, chewing tobacco, or electronic cigarettes.  If you are pregnant, do not drink alcohol.  If you are breastfeeding, limit how much and how often you drink alcohol.  Limit alcohol intake to no more than 1 drink per day for nonpregnant women. One drink equals 12 ounces of beer, 5 ounces of wine, or 1 ounces of hard liquor.  Do not use street drugs.  Do not share needles.  Ask your health care provider for help if you need support or information about quitting drugs.  Tell your health care provider if you often feel depressed.  Tell your health care provider if you have ever been abused or do not feel safe at home. This information is not intended to replace advice given to you by your health care provider. Make sure you discuss any questions you have with your health care provider. Document Released: 07/27/2010 Document Revised: 06/19/2015 Document Reviewed: 10/15/2014 Elsevier Interactive Patient Education  2018 [employer]vier Inc.  

## 2017-03-02 ENCOUNTER — Ambulatory Visit (INDEPENDENT_AMBULATORY_CARE_PROVIDER_SITE_OTHER): Payer: 59 | Admitting: Internal Medicine

## 2017-03-02 ENCOUNTER — Other Ambulatory Visit (INDEPENDENT_AMBULATORY_CARE_PROVIDER_SITE_OTHER): Payer: 59

## 2017-03-02 ENCOUNTER — Encounter: Payer: Self-pay | Admitting: Internal Medicine

## 2017-03-02 VITALS — BP 100/62 | HR 57 | Temp 98.0°F | Resp 16 | Ht 65.0 in | Wt 134.0 lb

## 2017-03-02 DIAGNOSIS — Z832 Family history of diseases of the blood and blood-forming organs and certain disorders involving the immune mechanism: Secondary | ICD-10-CM | POA: Diagnosis not present

## 2017-03-02 DIAGNOSIS — G43809 Other migraine, not intractable, without status migrainosus: Secondary | ICD-10-CM | POA: Diagnosis not present

## 2017-03-02 DIAGNOSIS — Z0000 Encounter for general adult medical examination without abnormal findings: Secondary | ICD-10-CM | POA: Diagnosis not present

## 2017-03-02 LAB — CBC WITH DIFFERENTIAL/PLATELET
Basophils Absolute: 0 10*3/uL (ref 0.0–0.1)
Basophils Relative: 0.5 % (ref 0.0–3.0)
Eosinophils Absolute: 0.1 10*3/uL (ref 0.0–0.7)
Eosinophils Relative: 1.4 % (ref 0.0–5.0)
HCT: 42 % (ref 36.0–46.0)
Hemoglobin: 14.5 g/dL (ref 12.0–15.0)
Lymphocytes Relative: 25.6 % (ref 12.0–46.0)
Lymphs Abs: 2 10*3/uL (ref 0.7–4.0)
MCHC: 34.4 g/dL (ref 30.0–36.0)
MCV: 89.9 fl (ref 78.0–100.0)
Monocytes Absolute: 0.4 10*3/uL (ref 0.1–1.0)
Monocytes Relative: 4.9 % (ref 3.0–12.0)
Neutro Abs: 5.2 10*3/uL (ref 1.4–7.7)
Neutrophils Relative %: 67.6 % (ref 43.0–77.0)
Platelets: 202 10*3/uL (ref 150.0–400.0)
RBC: 4.67 Mil/uL (ref 3.87–5.11)
RDW: 13.4 % (ref 11.5–15.5)
WBC: 7.7 10*3/uL (ref 4.0–10.5)

## 2017-03-02 LAB — COMPREHENSIVE METABOLIC PANEL
ALT: 10 U/L (ref 0–35)
AST: 12 U/L (ref 0–37)
Albumin: 4 g/dL (ref 3.5–5.2)
Alkaline Phosphatase: 39 U/L (ref 39–117)
BUN: 12 mg/dL (ref 6–23)
CO2: 28 mEq/L (ref 19–32)
Calcium: 8.9 mg/dL (ref 8.4–10.5)
Chloride: 105 mEq/L (ref 96–112)
Creatinine, Ser: 0.71 mg/dL (ref 0.40–1.20)
GFR: 91.47 mL/min (ref >60.00)
Glucose, Bld: 88 mg/dL (ref 70–99)
Potassium: 3.8 mEq/L (ref 3.5–5.1)
Sodium: 142 mEq/L (ref 135–145)
Total Bilirubin: 0.5 mg/dL (ref 0.2–1.2)
Total Protein: 6.5 g/dL (ref 6.0–8.3)

## 2017-03-02 LAB — LIPID PANEL
Cholesterol: 208 mg/dL — ABNORMAL HIGH (ref 0–200)
HDL: 70.2 mg/dL (ref >39.00)
LDL Cholesterol: 125 mg/dL — ABNORMAL HIGH (ref 0–99)
NonHDL: 138.06
Total CHOL/HDL Ratio: 3
Triglycerides: 66 mg/dL (ref 0.0–149.0)
VLDL: 13.2 mg/dL (ref 0.0–40.0)

## 2017-03-02 LAB — VITAMIN B12: Vitamin B-12: 275 pg/mL (ref 211–911)

## 2017-03-02 LAB — TSH: TSH: 0.91 u[IU]/mL (ref 0.35–4.50)

## 2017-03-02 NOTE — Assessment & Plan Note (Addendum)
Takes otc medication or sumatriptan as needed Has one every 2 months Follows with neurology

## 2017-03-02 NOTE — Assessment & Plan Note (Signed)
Check B12 level. 

## 2017-04-27 ENCOUNTER — Ambulatory Visit (INDEPENDENT_AMBULATORY_CARE_PROVIDER_SITE_OTHER): Payer: 59 | Admitting: Adult Health

## 2017-04-27 ENCOUNTER — Encounter: Payer: Self-pay | Admitting: Adult Health

## 2017-04-27 VITALS — BP 100/62 | HR 56 | Ht 65.0 in | Wt 136.4 lb

## 2017-04-27 DIAGNOSIS — G43009 Migraine without aura, not intractable, without status migrainosus: Secondary | ICD-10-CM

## 2017-04-27 DIAGNOSIS — H60393 Other infective otitis externa, bilateral: Secondary | ICD-10-CM | POA: Diagnosis not present

## 2017-04-27 DIAGNOSIS — H9193 Unspecified hearing loss, bilateral: Secondary | ICD-10-CM | POA: Diagnosis not present

## 2017-04-27 NOTE — Patient Instructions (Signed)
Your Plan:  Continue to monitor symptoms  If headaches worsen then let us know  [facility]. I hope we have been able to provide you high quality care today.  You may receive a patient satisfaction survey over the next few weeks. We would appreciate your feedback and comments so that we may continue to improve ourselves and the health of our patients.

## 2017-04-27 NOTE — Progress Notes (Signed)
PATIENT: Kathleen Hays DOB: 04/28/1965  REASON FOR VISIT: follow up HISTORY FROM: patient  HISTORY OF PRESENT ILLNESS: Today 04/27/17 Ms. Kathleen Hays is a 52 year old female with a history of migraine headaches.  She returns today for follow-up.  She reports that since January she has not had any headaches.  She reports that she eliminated peanuts and peanut butter from her diet and has since not had any headaches.  She reports that she has not had to use Imitrex.  Overall she states that she is doing well.  She returns today for evaluation.   HISTORY 02/16/2016: Ms. Kathleen Hays is a 52 year old female with a history of migraine headaches. She returns today for follow-up. She is currently only taking Imitrex for her headaches. She states that she has maybe 1 headache a month. She states that since she started taking Tart Cherry her headaches have decreased. She also states that the weather is a trigger for her migraines. Imitrex seems to work well for the patient. She denies any new neurological symptoms. She returns today for an evaluation.  REVIEW OF SYSTEMS: Out of a complete 14 system review of symptoms, the patient complains only of the following symptoms, and all other reviewed systems are negative.  See HPI  ALLERGIES: Allergies  Allergen Reactions  . Avelox [Moxifloxacin Hcl In Nacl]     HOME MEDICATIONS: Outpatient Medications Prior to Visit  Medication Sig Dispense Refill  . Misc Natural Products (TART CHERRY ADVANCED PO) Take 2 capsules by mouth daily.    . Multiple Vitamin (MULTIVITAMIN) tablet Take 1 tablet by mouth daily.    . naproxen sodium (ALEVE) 220 MG tablet Take 220 mg by mouth 2 (two) times daily as needed.    . SUMAtriptan (IMITREX) 100 MG tablet TAKE 1 TABLET AT ONSET OF HEADACHE AND MAY REPEAT IN 2 HOURS IF PERSISTS OR RECURS MAX 2TABS/24HRS 10 tablet 5  . aspirin-acetaminophen-caffeine (EXCEDRIN MIGRAINE) 250-250-65 MG tablet Take 1 tablet by mouth every 6  (six) hours as needed for headache.     No facility-administered medications prior to visit.     PAST MEDICAL HISTORY: Past Medical History:  Diagnosis Date  . Depression    PTSD secondary to witnessed father suicide (gunshot to chest)  . HA (headache)     PAST SURGICAL HISTORY: Past Surgical History:  Procedure Laterality Date  . CESAREAN SECTION    . MOUTH SURGERY    . TONSILLECTOMY AND ADENOIDECTOMY    . WISDOM TOOTH EXTRACTION Bilateral 1984   Patient was 52 years old.     FAMILY HISTORY: Family History  Problem Relation Age of Onset  . High blood pressure Mother   . Migraines Mother   . Thyroid disease Mother   . Depression Father   . Suicidality Father        gunshot  . Diabetes Maternal Grandfather   . Diabetes Paternal Grandmother     SOCIAL HISTORY: Social History   Socioeconomic History  . Marital status: Married    Spouse name: Kathleen Hays  . Number of children: 2  . Years of education: Charity fundraiserN  . Highest education level: Not on file  Occupational History  . Occupation: Engineering geologistN     Employer: [employer]  Social Needs  . Financial resource strain: Not on file  . Food insecurity:    Worry: Not on file    Inability: Not on file  . Transportation needs:    Medical: Not on file    Non-medical: Not on  file  Tobacco Use  . Smoking status: Former Smoker    Types: Cigarettes    Last attempt to quit: 2010    Years since quitting: 9.2  . Smokeless tobacco: Never Used  . Tobacco comment: Quit 2015  Substance and Sexual Activity  . Alcohol use: No    Alcohol/week: 0.0 oz  . Drug use: No  . Sexual activity: Yes    Partners: Male    Birth control/protection: Other-see comments, Post-menopausal    Comment: Without menstral cycle for 3 year on January 2018  Lifestyle  . Physical activity:    Days per week: Not on file    Minutes per session: Not on file  . Stress: Not on file  Relationships  . Social connections:    Talks on phone: Not on file    Gets  together: Not on file    Attends religious service: Not on file    Active member of club or organization: Not on file    Attends meetings of clubs or organizations: Not on file    Relationship status: Not on file  . Intimate partner violence:    Fear of current or ex partner: Not on file    Emotionally abused: Not on file    Physically abused: Not on file    Forced sexual activity: Not on file  Other Topics Concern  . Not on file  Social History Narrative   Patient lives at home with her husband Kathleen Loft  and daughter.   Patient works at [employer]    Civil Service fast streamer.   Right handed.   Caffeine none.      PHYSICAL EXAM  Vitals:   04/27/17 0716  BP: 100/62  Pulse: (!) 56  Weight: 136 lb 6.4 oz (61.9 kg)  Height: 5\' 5"  (1.651 m)   Body mass index is 22.7 kg/m.  Generalized: Well developed, in no acute distress   Neurological examination  Mentation: Alert oriented to time, place, history taking. Follows all commands speech and language fluent Cranial nerve II-XII: Pupils were equal round reactive to light. Extraocular movements were full, visual field were full on confrontational test. Facial sensation and strength were normal. Uvula tongue midline. Head turning and shoulder shrug  were normal and symmetric. Motor: The motor testing reveals 5 over 5 strength of all 4 extremities. Good symmetric motor tone is noted throughout.  Sensory: Sensory testing is intact to soft touch on all 4 extremities. No evidence of extinction is noted.  Coordination: Cerebellar testing reveals good finger-nose-finger and heel-to-shin bilaterally.  Gait and station: Gait is normal.  Reflexes: Deep tendon reflexes are symmetric and normal bilaterally.   DIAGNOSTIC DATA (LABS, IMAGING, TESTING) - I reviewed patient records, labs, notes, testing and imaging myself where available.  Lab Results  Component Value Date   WBC 7.7 03/02/2017   HGB 14.5 03/02/2017   HCT 42.0  03/02/2017   MCV 89.9 03/02/2017   PLT 202.0 03/02/2017      Component Value Date/Time   NA 142 03/02/2017 0824   K 3.8 03/02/2017 0824   CL 105 03/02/2017 0824   CO2 28 03/02/2017 0824   GLUCOSE 88 03/02/2017 0824   BUN 12 03/02/2017 0824   CREATININE 0.71 03/02/2017 0824   CALCIUM 8.9 03/02/2017 0824   PROT 6.5 03/02/2017 0824   ALBUMIN 4.0 03/02/2017 0824   AST 12 03/02/2017 0824   ALT 10 03/02/2017 0824   ALKPHOS 39 03/02/2017 0824   BILITOT 0.5 03/02/2017  1610   Lab Results  Component Value Date   CHOL 208 (H) 03/02/2017   HDL 70.20 03/02/2017   LDLCALC 125 (H) 03/02/2017   TRIG 66.0 03/02/2017   CHOLHDL 3 03/02/2017   No results found for: HGBA1C Lab Results  Component Value Date   VITAMINB12 275 03/02/2017   Lab Results  Component Value Date   TSH 0.91 03/02/2017      ASSESSMENT AND PLAN 52 y.o. year old female  has a past medical history of Depression and HA (headache). here with:  1.  Migraine headache  Overall the patient has done well over the last 4 months.  She denies any headaches.  She has not had to use Imitrex.  I have advised the patient that if her headache worsen or increase in frequency she should let us know.  Otherwise she will follow-up with our office on an as-needed basis.  Butch Penny, MSN, NP-C 04/27/2017, 7:45 AM [facility] [address] [place], [place] [address] [phone]  I spent 15 minutes with the patient. 50% of this time was spent reviewing headache frequency and medication

## 2017-04-27 NOTE — Progress Notes (Signed)
I agree with the assessment and plan as directed by NP .The patient is known to me .   DOHMEIER,CARMEN, MD  

## 2017-05-17 DIAGNOSIS — H60393 Other infective otitis externa, bilateral: Secondary | ICD-10-CM | POA: Diagnosis not present

## 2017-05-30 ENCOUNTER — Telehealth: Payer: Self-pay

## 2017-05-30 ENCOUNTER — Ambulatory Visit (INDEPENDENT_AMBULATORY_CARE_PROVIDER_SITE_OTHER): Payer: 59

## 2017-05-30 DIAGNOSIS — Z23 Encounter for immunization: Secondary | ICD-10-CM

## 2017-05-30 NOTE — Telephone Encounter (Signed)
Ok - will need both doses - one month apart

## 2017-05-30 NOTE — Telephone Encounter (Signed)
Pt informed and she will schedule a nurse visit.

## 2017-05-30 NOTE — Telephone Encounter (Addendum)
Pt is requesting an MMR vaccine. Pt wanted to know if that is okay. Pt states that she had a titer done 20 years ago and the titer result and there was no immunity to [place] Measles. Please advise.

## 2017-06-07 DIAGNOSIS — H60393 Other infective otitis externa, bilateral: Secondary | ICD-10-CM | POA: Diagnosis not present

## 2017-06-30 ENCOUNTER — Ambulatory Visit (INDEPENDENT_AMBULATORY_CARE_PROVIDER_SITE_OTHER): Payer: 59

## 2017-06-30 DIAGNOSIS — Z23 Encounter for immunization: Secondary | ICD-10-CM | POA: Diagnosis not present

## 2017-09-07 NOTE — Progress Notes (Signed)
Patient   Subjective:    Patient ID: Kathleen Hays, female    DOB: 01/22/1966, 52 y.o.   MRN: [id number]  HPI The patient is here for follow up.  Osteopenia: She is very active at work and does a lot of walking.  She generally does not exercise outside of work.  She is taking vitamin D and calcium on a regular basis.  She is concerned if she is taking enough vitamin D and does not want her bones to progress to osteoporosis.  Low vitamin B12: Her vitamin B12 level was low when we checked it last.  She has been taking vitamin B12 daily for the past 6 months.  Her mother does have pernicious anemia and she is concerned about her ability to absorb B12.  Medications and allergies reviewed with patient and updated if appropriate.  Patient Active Problem List   Diagnosis Date Noted  . Low vitamin B12 level 09/08/2017  . Osteopenia 09/08/2017  . Family history of pernicious anemia 03/02/2017  . Hx of anorexia nervosa 01/23/2016  . History of colonic polyps 01/23/2016  . Migraines     Current Outpatient Medications on File Prior to Visit  Medication Sig Dispense Refill  . Misc Natural Products (TART CHERRY ADVANCED PO) Take 2 capsules by mouth daily.    . Multiple Vitamin (MULTIVITAMIN) tablet Take 1 tablet by mouth daily.    . naproxen sodium (ALEVE) 220 MG tablet Take 220 mg by mouth 2 (two) times daily as needed.    . SUMAtriptan (IMITREX) 100 MG tablet TAKE 1 TABLET AT ONSET OF HEADACHE AND MAY REPEAT IN 2 HOURS IF PERSISTS OR RECURS MAX 2TABS/24HRS 10 tablet 5   No current facility-administered medications on file prior to visit.     Past Medical History:  Diagnosis Date  . Depression    PTSD secondary to witnessed father suicide (gunshot to chest)  . HA (headache)     Past Surgical History:  Procedure Laterality Date  . CESAREAN SECTION    . MOUTH SURGERY    . TONSILLECTOMY AND ADENOIDECTOMY    . WISDOM TOOTH EXTRACTION Bilateral 1984   Patient was 52 years old.      Social History   Socioeconomic History  . Marital status: Married    Spouse name: Aurther Lofterry  . Number of children: 2  . Years of education: Charity fundraiserN  . Highest education level: Not on file  Occupational History  . Occupation: Engineering geologistN     Employer: [employer]  Social Needs  . Financial resource strain: Not on file  . Food insecurity:    Worry: Not on file    Inability: Not on file  . Transportation needs:    Medical: Not on file    Non-medical: Not on file  Tobacco Use  . Smoking status: Former Smoker    Types: Cigarettes    Last attempt to quit: 2010    Years since quitting: 9.6  . Smokeless tobacco: Never Used  . Tobacco comment: Quit 2015  Substance and Sexual Activity  . Alcohol use: No    Alcohol/week: 0.0 standard drinks  . Drug use: No  . Sexual activity: Yes    Partners: Male    Birth control/protection: Other-see comments, Post-menopausal    Comment: Without menstral cycle for 3 year on January 2018  Lifestyle  . Physical activity:    Days per week: Not on file    Minutes per session: Not on file  . Stress: Not on file  Relationships  . Social connections:    Talks on phone: Not on file    Gets together: Not on file    Attends religious service: Not on file    Active member of club or organization: Not on file    Attends meetings of clubs or organizations: Not on file    Relationship status: Not on file  Other Topics Concern  . Not on file  Social History Narrative   Patient lives at home with her husband Aurther Lofterry  and daughter.   Patient works at [employer] and [employer].    [employer] RN.   Right handed.   Caffeine none.    Family History  Problem Relation Age of Onset  . High blood pressure Mother   . Migraines Mother   . Thyroid disease Mother   . Depression Father   . Suicidality Father        gunshot  . Diabetes Maternal Grandfather   . Diabetes Paternal Grandmother     Review of Systems     Objective:   Vitals:   09/08/17 0744   BP: 110/78  Pulse: 70  Resp: 16  Temp: 97.9 F (36.6 C)  SpO2: 98%   BP Readings from Last 3 Encounters:  09/08/17 110/78  04/27/17 100/62  03/02/17 100/62   Wt Readings from Last 3 Encounters:  09/08/17 135 lb (61.2 kg)  04/27/17 136 lb 6.4 oz (61.9 kg)  03/02/17 134 lb (60.8 kg)   Body mass index is 22.47 kg/m.   Physical Exam         Assessment & Plan:   15 minutes were spent face-to-face with the patient, over 50% of which was spent counseling regarding her osteopenia.  Discussed the importance of exercise/increase activity, daily vitamin D and calcium.  Bone density is up-to-date and we did review her results.  Reviewed treatment options as needed in the near future.  Discussed monitoring vitamin D level.   See Problem List for Assessment and Plan of chronic medical problems.

## 2017-09-08 ENCOUNTER — Other Ambulatory Visit (INDEPENDENT_AMBULATORY_CARE_PROVIDER_SITE_OTHER): Payer: 59

## 2017-09-08 ENCOUNTER — Encounter: Payer: Self-pay | Admitting: Internal Medicine

## 2017-09-08 ENCOUNTER — Ambulatory Visit (INDEPENDENT_AMBULATORY_CARE_PROVIDER_SITE_OTHER): Payer: 59 | Admitting: Internal Medicine

## 2017-09-08 DIAGNOSIS — E538 Deficiency of other specified B group vitamins: Secondary | ICD-10-CM | POA: Diagnosis not present

## 2017-09-08 DIAGNOSIS — M8580 Other specified disorders of bone density and structure, unspecified site: Secondary | ICD-10-CM | POA: Insufficient documentation

## 2017-09-08 DIAGNOSIS — M85851 Other specified disorders of bone density and structure, right thigh: Secondary | ICD-10-CM

## 2017-09-08 DIAGNOSIS — R7989 Other specified abnormal findings of blood chemistry: Secondary | ICD-10-CM

## 2017-09-08 DIAGNOSIS — M85852 Other specified disorders of bone density and structure, left thigh: Secondary | ICD-10-CM | POA: Diagnosis not present

## 2017-09-08 LAB — VITAMIN D 25 HYDROXY (VIT D DEFICIENCY, FRACTURES): VITD: 53.77 ng/mL (ref 30.00–100.00)

## 2017-09-08 LAB — VITAMIN B12: Vitamin B-12: >1500 pg/mL — ABNORMAL HIGH (ref 211–911)

## 2017-09-08 NOTE — Assessment & Plan Note (Signed)
She has been taking B12 daily for the last 6 months She does feel her energy level is little better Had a low level 6 months ago and has a family history of pernicious anemia Recheck vitamin B12 level

## 2017-09-08 NOTE — Patient Instructions (Signed)
  Tests ordered today. Your results will be released to MyChart (or called to you) after review, usually within 72hours after test completion. If any changes need to be made, you will be notified at that same time.    

## 2017-09-08 NOTE — Assessment & Plan Note (Signed)
Reviewed bone density results-next DEXA due January 2020 Continue calcium vitamin D daily Continue increase activity/exercise We will check vitamin D level to make sure she is at a good level Reviewed treatment options if needed in the near future, but hopefully we can avoid that

## 2017-09-22 ENCOUNTER — Telehealth: Payer: Self-pay

## 2017-09-22 MED ORDER — CEPHALEXIN 500 MG PO CAPS: 500.0000 mg | ORAL_CAPSULE | Freq: Two times a day (BID) | ORAL | 0 refills | Status: DC

## 2017-09-22 NOTE — Telephone Encounter (Signed)
Keflex sent

## 2017-09-22 NOTE — Telephone Encounter (Signed)
Pt stated that she is having UTI sxs of urgency, trace of blood in urine and pressure. Can you send in an antibiotic to [facility] on [address]. She will bring in a urine if you rather her do that.

## 2017-11-12 ENCOUNTER — Other Ambulatory Visit: Payer: Self-pay | Admitting: Neurology

## 2017-11-12 DIAGNOSIS — G43821 Menstrual migraine, not intractable, with status migrainosus: Secondary | ICD-10-CM

## 2018-01-28 ENCOUNTER — Ambulatory Visit (INDEPENDENT_AMBULATORY_CARE_PROVIDER_SITE_OTHER): Payer: 59 | Admitting: Family Medicine

## 2018-01-28 ENCOUNTER — Encounter: Payer: Self-pay | Admitting: Family Medicine

## 2018-01-28 VITALS — BP 98/68 | HR 76 | Temp 98.5°F | Resp 14 | Ht 65.0 in | Wt 135.0 lb

## 2018-01-28 DIAGNOSIS — J208 Acute bronchitis due to other specified organisms: Secondary | ICD-10-CM

## 2018-01-28 DIAGNOSIS — B9689 Other specified bacterial agents as the cause of diseases classified elsewhere: Secondary | ICD-10-CM | POA: Diagnosis not present

## 2018-01-28 MED ORDER — BENZONATATE 100 MG PO CAPS: 100.0000 mg | ORAL_CAPSULE | Freq: Two times a day (BID) | ORAL | 0 refills | Status: DC | PRN

## 2018-01-28 MED ORDER — AZITHROMYCIN 250 MG PO TABS: ORAL_TABLET | ORAL | 0 refills | Status: DC

## 2018-01-28 NOTE — Patient Instructions (Signed)
Please follow up if symptoms do not improve or as needed.    Acute Bronchitis, Adult  Acute bronchitis is sudden (acute) swelling of the air tubes (bronchi) in the lungs. Acute bronchitis causes these tubes to fill with mucus, which can make it hard to breathe. It can also cause coughing or wheezing. In adults, acute bronchitis usually goes away within 2 weeks. A cough caused by bronchitis may last up to 3 weeks. Smoking, allergies, and asthma can make the condition worse. Repeated episodes of bronchitis may cause further lung problems, such as chronic obstructive pulmonary disease (COPD). What are the causes? This condition can be caused by germs and by substances that irritate the lungs, including:  Cold and flu viruses. This condition is most often caused by the same virus that causes a cold.  Bacteria.  Exposure to tobacco smoke, dust, fumes, and air pollution. What increases the risk? This condition is more likely to develop in people who:  Have close contact with someone with acute bronchitis.  Are exposed to lung irritants, such as tobacco smoke, dust, fumes, and vapors.  Have a weak immune system.  Have a respiratory condition such as asthma. What are the signs or symptoms? Symptoms of this condition include:  A cough.  Coughing up clear, yellow, or green mucus.  Wheezing.  Chest congestion.  Shortness of breath.  A fever.  Body aches.  Chills.  A sore throat. How is this diagnosed? This condition is usually diagnosed with a physical exam. During the exam, your health care provider may order tests, such as chest X-rays, to rule out other conditions. He or she may also:  Test a sample of your mucus for bacterial infection.  Check the level of oxygen in your blood. This is done to check for pneumonia.  Do a chest X-ray or lung function testing to rule out pneumonia and other conditions.  Perform blood tests. Your health care provider will also ask about  your symptoms and medical history. How is this treated? Most cases of acute bronchitis clear up over time without treatment. Your health care provider may recommend:  Drinking more fluids. Drinking more makes your mucus thinner, which may make it easier to breathe.  Taking a medicine for a fever or cough.  Taking an antibiotic medicine.  Using an inhaler to help improve shortness of breath and to control a cough.  Using a cool mist vaporizer or humidifier to make it easier to breathe. Follow these instructions at home: Medicines  Take over-the-counter and prescription medicines only as told by your health care provider.  If you were prescribed an antibiotic, take it as told by your health care provider. Do not stop taking the antibiotic even if you start to feel better. General instructions   Get plenty of rest.  Drink enough fluids to keep your urine pale yellow.  Avoid smoking and secondhand smoke. Exposure to cigarette smoke or irritating chemicals will make bronchitis worse. If you smoke and you need help quitting, ask your health care provider. Quitting smoking will help your lungs heal faster.  Use an inhaler, cool mist vaporizer, or humidifier as told by your health care provider.  Keep all follow-up visits as told by your health care provider. This is important. How is this prevented? To lower your risk of getting this condition again:  Wash your hands often with soap and water. If soap and water are not available, use hand sanitizer.  Avoid contact with people who have cold symptoms.    Try not to touch your hands to your mouth, nose, or eyes.  Make sure to get the flu shot every year. Contact a health care provider if:  Your symptoms do not improve in 2 weeks of treatment. Get help right away if:  You cough up blood.  You have chest pain.  You have severe shortness of breath.  You become dehydrated.  You faint or keep feeling like you are going to faint.   You keep vomiting.  You have a severe headache.  Your fever or chills gets worse. This information is not intended to replace advice given to you by your health care provider. Make sure you discuss any questions you have with your health care provider. Document Released: 02/19/2004 Document Revised: 08/25/2016 Document Reviewed: 07/02/2015 Elsevier Interactive Patient Education  2019 Elsevier Inc.   

## 2018-01-28 NOTE — Progress Notes (Signed)
Subjective  CC:  Chief Complaint  Patient presents with  . Sore Throat    started last Sunday, cough,    [facility]. New pt to me. Chart reviewed.  HPI: SUBJECTIVE:  Kathleen Hays is a 53 y.o. female who complains of congestion, nasal blockage, post nasal drip, cough described as productive and denies sinus, high fevers, SOB, chest pain or significant GI symptoms. Symptoms have been present for 1 week and not improving. She denies a history of anorexia, dizziness, vomiting and wheezing. She denies a history of asthma or COPD. Patient does not smoke cigarettes. wuit in 2010  Assessment  1. Acute bacterial bronchitis      Plan  Discussion:  Treat for bacterial bronchitis due to prolonged course and worsening symptoms. Education regarding differences between viral and bacterial infections and treatment options are discussed.  Supportive care measures are recommended.  We discussed the use of mucolytic's, decongestants, antihistamines and antitussives as needed.  Tylenol or Advil are recommended if needed.  Follow up: Return if symptoms worsen or fail to improve.   No orders of the defined types were placed in this encounter.  Meds ordered this encounter  Medications  . azithromycin (ZITHROMAX) 250 MG tablet    Sig: Take 2 tabs today, then 1 tab daily for 4 days    Dispense:  1 each    Refill:  0  . benzonatate (TESSALON) 100 MG capsule    Sig: Take 1 capsule (100 mg total) by mouth 2 (two) times daily as needed for cough.    Dispense:  20 capsule    Refill:  0      I reviewed the patients updated PMH, FH, and SocHx.  Social History: Patient  reports that she quit smoking about 10 years ago. Her smoking use included cigarettes. She has never used smokeless tobacco. She reports that she does not drink alcohol or use drugs.  Patient Active Problem List   Diagnosis Date Noted  . Low vitamin B12 level 09/08/2017  . Osteopenia  09/08/2017  . Family history of pernicious anemia 03/02/2017  . Hx of anorexia nervosa 01/23/2016  . History of colonic polyps 01/23/2016  . Migraines     Review of Systems: Cardiovascular: negative for chest pain Respiratory: negative for SOB or hemoptysis Gastrointestinal: negative for abdominal pain Genitourinary: negative for dysuria or gross hematuria Current Meds  Medication Sig  . Multiple Vitamin (MULTIVITAMIN) tablet Take 1 tablet by mouth daily.  . naproxen sodium (ALEVE) 220 MG tablet Take 220 mg by mouth 2 (two) times daily as needed.  . SUMAtriptan (IMITREX) 100 MG tablet TAKE 1 TABLET AT ONSET OF HEADACHE AND MAY REPEAT IN 2 HOURS IF PERSISTS OR RECURS MAX 2TABS/24HRS    Objective  Vitals: BP 98/68 (BP Location: Left Arm, Patient Position: Sitting, Cuff Size: Normal)   Pulse 76   Temp 98.5 F (36.9 C) (Oral)   Resp 14   Ht 5\' 5"  (1.651 m)   Wt 135 lb (61.2 kg)   LMP 01/25/2013 (Approximate)   SpO2 96%   BMI 22.47 kg/m  General: no acute distress  Psych:  Alert and oriented, normal mood and affect HEENT:  Normocephalic, atraumatic, supple neck, moist mucous membranes, mildly erythematous pharynx without exudate, mild lymphadenopathy, supple neck Cardiovascular:  RRR without murmur. no edema Respiratory:  Good breath sounds bilaterally, CTAB with normal respiratory effort with occasional rhonchi Skin:  Warm, no rashes Neurologic:   Mental status  is normal. normal gait  Commons side effects, risks, benefits, and alternatives for medications and treatment plan prescribed today were discussed, and the patient expressed understanding of the given instructions. Patient is instructed to call or message via MyChart if he/she has any questions or concerns regarding our treatment plan. No barriers to understanding were identified. We discussed Red Flag symptoms and signs in detail. Patient expressed understanding regarding what to do in case of urgent or emergency type  symptoms.  Medication list was reconciled, printed and provided to the patient in AVS. Patient instructions and summary information was reviewed with the patient as documented in the AVS. This note was prepared with assistance of Dragon voice recognition software. Occasional wrong-word or sound-a-like substitutions may have occurred due to the inherent limitations of voice recognition software

## 2018-03-02 NOTE — Assessment & Plan Note (Addendum)
Following with neurology Takes over-the-counter medication or Imitrex as needed Approximately 1 migraine every 2 months

## 2018-03-02 NOTE — Progress Notes (Signed)
Subjective:    Patient ID: Kathleen Hays, female    DOB: 1965/03/06, 53 y.o.   MRN: [id number]  HPI She is here for a physical exam.   She denies any changes in her health.  She has no concerns or questions.   Medications and allergies reviewed with patient and updated if appropriate.  Patient Active Problem List   Diagnosis Date Noted  . Low vitamin B12 level 09/08/2017  . Osteopenia 09/08/2017  . Family history of pernicious anemia 03/02/2017  . Hx of anorexia nervosa 01/23/2016  . History of colonic polyps 01/23/2016  . Migraines     Current Outpatient Medications on File Prior to Visit  Medication Sig Dispense Refill  . Misc Natural Products (TART CHERRY ADVANCED PO) Take 2 capsules by mouth daily.    . Multiple Vitamin (MULTIVITAMIN) tablet Take 1 tablet by mouth daily.    . naproxen sodium (ALEVE) 220 MG tablet Take 220 mg by mouth 2 (two) times daily as needed.    . SUMAtriptan (IMITREX) 100 MG tablet TAKE 1 TABLET AT ONSET OF HEADACHE AND MAY REPEAT IN 2 HOURS IF PERSISTS OR RECURS MAX 2TABS/24HRS 10 tablet 5   No current facility-administered medications on file prior to visit.     Past Medical History:  Diagnosis Date  . Depression    PTSD secondary to witnessed father suicide (gunshot to chest)  . HA (headache)     Past Surgical History:  Procedure Laterality Date  . CESAREAN SECTION    . MOUTH SURGERY    . TONSILLECTOMY AND ADENOIDECTOMY    . WISDOM TOOTH EXTRACTION Bilateral 1984   Patient was 53 years old.     Social History   Socioeconomic History  . Marital status: Married    Spouse name: Kathleen Hays  . Number of children: 2  . Years of education: Charity fundraiser  . Highest education level: Not on file  Occupational History  . Occupation: Engineering geologist: [employer]  Social Needs  . Financial resource strain: Not on file  . Food insecurity:    Worry: Not on file    Inability: Not on file  . Transportation needs:    Medical: Not on file   Non-medical: Not on file  Tobacco Use  . Smoking status: Former Smoker    Types: Cigarettes    Last attempt to quit: 2010    Years since quitting: 10.1  . Smokeless tobacco: Never Used  . Tobacco comment: Quit 2015  Substance and Sexual Activity  . Alcohol use: No    Alcohol/week: 0.0 standard drinks  . Drug use: No  . Sexual activity: Yes    Partners: Male    Birth control/protection: Other-see comments, Post-menopausal    Comment: Without menstral cycle for 3 year on January 2018  Lifestyle  . Physical activity:    Days per week: Not on file    Minutes per session: Not on file  . Stress: Not on file  Relationships  . Social connections:    Talks on phone: Not on file    Gets together: Not on file    Attends religious service: Not on file    Active member of club or organization: Not on file    Attends meetings of clubs or organizations: Not on file    Relationship status: Not on file  Other Topics Concern  . Not on file  Social History Narrative   Patient lives at home with her husband Kathleen Hays  and daughter.   Patient works at [employer] and [employer].    [employer] RN.   Right handed.   Caffeine none.    Family History  Problem Relation Age of Onset  . High blood pressure Mother   . Migraines Mother   . Thyroid disease Mother   . Depression Father   . Suicidality Father        gunshot  . Diabetes Maternal Grandfather   . Diabetes Paternal Grandmother     Review of Systems  Constitutional: Negative for chills and fever.  HENT: Positive for nosebleeds.   Eyes: Negative for visual disturbance.  Respiratory: Negative for cough, shortness of breath and wheezing.   Cardiovascular: Negative for chest pain, palpitations and leg swelling.  Gastrointestinal: Negative for abdominal pain, blood in stool, constipation, diarrhea and nausea.  Genitourinary: Negative for dysuria and hematuria.  Musculoskeletal: Negative for arthralgias and back pain.  Skin:  Negative for rash.       Dryness on elbows  Neurological: Positive for headaches (migraines occ). Negative for dizziness, light-headedness and numbness.  Psychiatric/Behavioral: Negative for dysphoric mood. The patient is not nervous/anxious.        Objective:   Vitals:   03/03/18 0818  BP: 104/72  Pulse: 75  Resp: 16  Temp: 98.5 F (36.9 C)  SpO2: 97%   Filed Weights   03/03/18 0818  Weight: 135 lb (61.2 kg)   Body mass index is 22.47 kg/m.  BP Readings from Last 3 Encounters:  03/03/18 104/72  01/28/18 98/68  09/08/17 110/78    Wt Readings from Last 3 Encounters:  03/03/18 135 lb (61.2 kg)  01/28/18 135 lb (61.2 kg)  09/08/17 135 lb (61.2 kg)     Physical Exam Constitutional: She appears well-developed and well-nourished. No distress.  HENT:  Head: Normocephalic and atraumatic.  Right Ear: External ear normal. Normal ear canal and TM Left Ear: External ear normal.  Normal ear canal and TM Mouth/Throat: Oropharynx is clear and moist.  Eyes: Conjunctivae and EOM are normal.  Neck: Neck supple. No tracheal deviation present. No thyromegaly present.  No carotid bruit  Cardiovascular: Normal rate, regular rhythm and normal heart sounds.   No murmur heard.  No edema. Pulmonary/Chest: Effort normal and breath sounds normal. No respiratory distress. She has no wheezes. She has no rales.  Breast: deferred Abdominal: Soft. She exhibits no distension. There is no tenderness.  Lymphadenopathy: She has no cervical adenopathy.  Skin: Skin is warm and dry. She is not diaphoretic.  Psychiatric: She has a normal mood and affect. Her behavior is normal.        Assessment & Plan:   Physical exam: Screening blood work ordered Immunizations discussed Shingrix, others up-to-date Colonoscopy   deferred Mammogram    deferred Gyn    deferred DEXA  - due - will order Eye exams    up-to-date Exercise   very active at work Weight   normal BMI Skin-no concerns Substance  abuse   none  See Problem List for Assessment and Plan of chronic medical problems.    Follow-up annually

## 2018-03-02 NOTE — Patient Instructions (Addendum)
Tests ordered today. Your results will be released to [place] (or called to you) after review, usually within 72hours after test completion. If any changes need to be made, you will be notified at that same time.    Medications reviewed and updated.  Changes include :   none    Please followup in 1 year for physical    Health Maintenance, Female Adopting a healthy lifestyle and getting preventive care can go a long way to promote health and wellness. Talk with your health care provider about what schedule of regular examinations is right for you. This is a good chance for you to check in with your provider about disease prevention and staying healthy. In between checkups, there are plenty of things you can do on your own. Experts have done a lot of research about which lifestyle changes and preventive measures are most likely to keep you healthy. Ask your health care provider for more information. Weight and diet Eat a healthy diet  Be sure to include plenty of vegetables, fruits, low-fat dairy products, and lean protein.  Do not eat a lot of foods high in solid fats, added sugars, or salt.  Get regular exercise. This is one of the most important things you can do for your health. ? Most adults should exercise for at least 150 minutes each week. The exercise should increase your heart rate and make you sweat (moderate-intensity exercise). ? Most adults should also do strengthening exercises at least twice a week. This is in addition to the moderate-intensity exercise. Maintain a healthy weight  Body mass index (BMI) is a measurement that can be used to identify possible weight problems. It estimates body fat based on height and weight. Your health care provider can help determine your BMI and help you achieve or maintain a healthy weight.  For females 36 years of age and older: ? A BMI below 18.5 is considered underweight. ? A BMI of 18.5 to 24.9 is normal. ? A BMI of 25 to 29.9 is  considered overweight. ? A BMI of 30 and above is considered obese. Watch levels of cholesterol and blood lipids  You should start having your blood tested for lipids and cholesterol at 55 years of age, then have this test every 5 years.  You may need to have your cholesterol levels checked more often if: ? Your lipid or cholesterol levels are high. ? You are older than 55 years of age. ? You are at high risk for heart disease. Cancer screening Lung Cancer  Lung cancer screening is recommended for adults 83-35 years old who are at high risk for lung cancer because of a history of smoking.  A yearly low-dose CT scan of the lungs is recommended for people who: ? Currently smoke. ? Have quit within the past 15 years. ? Have at least a 30-pack-year history of smoking. A pack year is smoking an average of one pack of cigarettes a day for 1 year.  Yearly screening should continue until it has been 15 years since you quit.  Yearly screening should stop if you develop a health problem that would prevent you from having lung cancer treatment. Breast Cancer  Practice breast self-awareness. This means understanding how your breasts normally appear and feel.  It also means doing regular breast self-exams. Let your health care provider know about any changes, no matter how small.  If you are in your 20s or 30s, you should have a clinical breast exam (CBE) by a  health care provider every 1-3 years as part of a regular health exam.  If you are 37 or older, have a CBE every year. Also consider having a breast X-ray (mammogram) every year.  If you have a family history of breast cancer, talk to your health care provider about genetic screening.  If you are at high risk for breast cancer, talk to your health care provider about having an MRI and a mammogram every year.  Breast cancer gene (BRCA) assessment is recommended for women who have family members with BRCA-related cancers. BRCA-related  cancers include: ? Breast. ? Ovarian. ? Tubal. ? Peritoneal cancers.  Results of the assessment will determine the need for genetic counseling and BRCA1 and BRCA2 testing. Cervical Cancer Your health care provider may recommend that you be screened regularly for cancer of the pelvic organs (ovaries, uterus, and vagina). This screening involves a pelvic examination, including checking for microscopic changes to the surface of your cervix (Pap test). You may be encouraged to have this screening done every 3 years, beginning at age 49.  For women ages 15-65, health care providers may recommend pelvic exams and Pap testing every 3 years, or they may recommend the Pap and pelvic exam, combined with testing for human papilloma virus (HPV), every 5 years. Some types of HPV increase your risk of cervical cancer. Testing for HPV may also be done on women of any age with unclear Pap test results.  Other health care providers may not recommend any screening for nonpregnant women who are considered low risk for pelvic cancer and who do not have symptoms. Ask your health care provider if a screening pelvic exam is right for you.  If you have had past treatment for cervical cancer or a condition that could lead to cancer, you need Pap tests and screening for cancer for at least 20 years after your treatment. If Pap tests have been discontinued, your risk factors (such as having a new sexual partner) need to be reassessed to determine if screening should resume. Some women have medical problems that increase the chance of getting cervical cancer. In these cases, your health care provider may recommend more frequent screening and Pap tests. Colorectal Cancer  This type of cancer can be detected and often prevented.  Routine colorectal cancer screening usually begins at 55 years of age and continues through 55 years of age.  Your health care provider may recommend screening at an earlier age if you have risk  factors for colon cancer.  Your health care provider may also recommend using home test kits to check for hidden blood in the stool.  A small camera at the end of a tube can be used to examine your colon directly (sigmoidoscopy or colonoscopy). This is done to check for the earliest forms of colorectal cancer.  Routine screening usually begins at age 29.  Direct examination of the colon should be repeated every 5-10 years through 55 years of age. However, you may need to be screened more often if early forms of precancerous polyps or small growths are found. Skin Cancer  Check your skin from head to toe regularly.  Tell your health care provider about any new moles or changes in moles, especially if there is a change in a mole's shape or color.  Also tell your health care provider if you have a mole that is larger than the size of a pencil eraser.  Always use sunscreen. Apply sunscreen liberally and repeatedly throughout the day.  Protect yourself by wearing long sleeves, pants, a wide-brimmed hat, and sunglasses whenever you are outside. Heart disease, diabetes, and high blood pressure  High blood pressure causes heart disease and increases the risk of stroke. High blood pressure is more likely to develop in: ? People who have blood pressure in the high end of the normal range (130-139/85-89 mm Hg). ? People who are overweight or obese. ? People who are African American.  If you are 51-73 years of age, have your blood pressure checked every 3-5 years. If you are 29 years of age or older, have your blood pressure checked every year. You should have your blood pressure measured twice-once when you are at a hospital or clinic, and once when you are not at a hospital or clinic. Record the average of the two measurements. To check your blood pressure when you are not at a hospital or clinic, you can use: ? An automated blood pressure machine at a pharmacy. ? A home blood pressure  monitor.  If you are between 54 years and 54 years old, ask your health care provider if you should take aspirin to prevent strokes.  Have regular diabetes screenings. This involves taking a blood sample to check your fasting blood sugar level. ? If you are at a normal weight and have a low risk for diabetes, have this test once every three years after 55 years of age. ? If you are overweight and have a high risk for diabetes, consider being tested at a younger age or more often. Preventing infection Hepatitis B  If you have a higher risk for hepatitis B, you should be screened for this virus. You are considered at high risk for hepatitis B if: ? You were born in a country where hepatitis B is common. Ask your health care provider which countries are considered high risk. ? Your parents were born in a high-risk country, and you have not been immunized against hepatitis B (hepatitis B vaccine). ? You have HIV or AIDS. ? You use needles to inject street drugs. ? You live with someone who has hepatitis B. ? You have had sex with someone who has hepatitis B. ? You get hemodialysis treatment. ? You take certain medicines for conditions, including cancer, organ transplantation, and autoimmune conditions. Hepatitis C  Blood testing is recommended for: ? Everyone born from [birth year] through 1967. ? Anyone with known risk factors for hepatitis C. Sexually transmitted infections (STIs)  You should be screened for sexually transmitted infections (STIs) including gonorrhea and chlamydia if: ? You are sexually active and are younger than 55 years of age. ? You are older than 55 years of age and your health care provider tells you that you are at risk for this type of infection. ? Your sexual activity has changed since you were last screened and you are at an increased risk for chlamydia or gonorrhea. Ask your health care provider if you are at risk.  If you do not have HIV, but are at risk, it may be  recommended that you take a prescription medicine daily to prevent HIV infection. This is called pre-exposure prophylaxis (PrEP). You are considered at risk if: ? You are sexually active and do not regularly use condoms or know the HIV status of your partner(s). ? You take drugs by injection. ? You are sexually active with a partner who has HIV. Talk with your health care provider about whether you are at high risk of being infected with HIV.  If you choose to begin PrEP, you should first be tested for HIV. You should then be tested every 3 months for as long as you are taking PrEP. Pregnancy  If you are premenopausal and you may become pregnant, ask your health care provider about preconception counseling.  If you may become pregnant, take 400 to 800 micrograms (mcg) of folic acid every day.  If you want to prevent pregnancy, talk to your health care provider about birth control (contraception). Osteoporosis and menopause  Osteoporosis is a disease in which the bones lose minerals and strength with aging. This can result in serious bone fractures. Your risk for osteoporosis can be identified using a bone density scan.  If you are 32 years of age or older, or if you are at risk for osteoporosis and fractures, ask your health care provider if you should be screened.  Ask your health care provider whether you should take a calcium or vitamin D supplement to lower your risk for osteoporosis.  Menopause may have certain physical symptoms and risks.  Hormone replacement therapy may reduce some of these symptoms and risks. Talk to your health care provider about whether hormone replacement therapy is right for you. Follow these instructions at home:  Schedule regular health, dental, and eye exams.  Stay current with your immunizations.  Do not use any tobacco products including cigarettes, chewing tobacco, or electronic cigarettes.  If you are pregnant, do not drink alcohol.  If you are  breastfeeding, limit how much and how often you drink alcohol.  Limit alcohol intake to no more than 1 drink per day for nonpregnant women. One drink equals 12 ounces of beer, 5 ounces of wine, or 1 ounces of hard liquor.  Do not use street drugs.  Do not share needles.  Ask your health care provider for help if you need support or information about quitting drugs.  Tell your health care provider if you often feel depressed.  Tell your health care provider if you have ever been abused or do not feel safe at home. This information is not intended to replace advice given to you by your health care provider. Make sure you discuss any questions you have with your health care provider. Document Released: 07/27/2010 Document Revised: 06/19/2015 Document Reviewed: 10/15/2014 Elsevier Interactive Patient Education  2019 [employer].

## 2018-03-03 ENCOUNTER — Other Ambulatory Visit (INDEPENDENT_AMBULATORY_CARE_PROVIDER_SITE_OTHER): Payer: 59

## 2018-03-03 ENCOUNTER — Ambulatory Visit (INDEPENDENT_AMBULATORY_CARE_PROVIDER_SITE_OTHER): Payer: 59 | Admitting: Internal Medicine

## 2018-03-03 ENCOUNTER — Encounter: Payer: Self-pay | Admitting: Internal Medicine

## 2018-03-03 VITALS — BP 104/72 | HR 75 | Temp 98.5°F | Resp 16 | Ht 65.0 in | Wt 135.0 lb

## 2018-03-03 DIAGNOSIS — M85852 Other specified disorders of bone density and structure, left thigh: Secondary | ICD-10-CM | POA: Diagnosis not present

## 2018-03-03 DIAGNOSIS — E538 Deficiency of other specified B group vitamins: Secondary | ICD-10-CM

## 2018-03-03 DIAGNOSIS — M85851 Other specified disorders of bone density and structure, right thigh: Secondary | ICD-10-CM

## 2018-03-03 DIAGNOSIS — G43809 Other migraine, not intractable, without status migrainosus: Secondary | ICD-10-CM | POA: Diagnosis not present

## 2018-03-03 DIAGNOSIS — Z0000 Encounter for general adult medical examination without abnormal findings: Secondary | ICD-10-CM

## 2018-03-03 DIAGNOSIS — E559 Vitamin D deficiency, unspecified: Secondary | ICD-10-CM | POA: Insufficient documentation

## 2018-03-03 DIAGNOSIS — Z13820 Encounter for screening for osteoporosis: Secondary | ICD-10-CM | POA: Diagnosis not present

## 2018-03-03 LAB — CBC WITH DIFFERENTIAL/PLATELET
Basophils Absolute: 0.1 10*3/uL (ref 0.0–0.1)
Basophils Relative: 0.8 % (ref 0.0–3.0)
Eosinophils Absolute: 0.1 10*3/uL (ref 0.0–0.7)
Eosinophils Relative: 1 % (ref 0.0–5.0)
HCT: 44.8 % (ref 36.0–46.0)
Hemoglobin: 15.4 g/dL — ABNORMAL HIGH (ref 12.0–15.0)
Lymphocytes Relative: 22.2 % (ref 12.0–46.0)
Lymphs Abs: 1.3 10*3/uL (ref 0.7–4.0)
MCHC: 34.5 g/dL (ref 30.0–36.0)
MCV: 88.4 fl (ref 78.0–100.0)
Monocytes Absolute: 0.3 10*3/uL (ref 0.1–1.0)
Monocytes Relative: 4.3 % (ref 3.0–12.0)
Neutro Abs: 4.3 10*3/uL (ref 1.4–7.7)
Neutrophils Relative %: 71.7 % (ref 43.0–77.0)
Platelets: 207 10*3/uL (ref 150.0–400.0)
RBC: 5.07 Mil/uL (ref 3.87–5.11)
RDW: 13.3 % (ref 11.5–15.5)
WBC: 6 10*3/uL (ref 4.0–10.5)

## 2018-03-03 LAB — COMPREHENSIVE METABOLIC PANEL
ALT: 16 U/L (ref 0–35)
AST: 18 U/L (ref 0–37)
Albumin: 4.3 g/dL (ref 3.5–5.2)
Alkaline Phosphatase: 44 U/L (ref 39–117)
BUN: 17 mg/dL (ref 6–23)
CO2: 26 mEq/L (ref 19–32)
Calcium: 9.3 mg/dL (ref 8.4–10.5)
Chloride: 103 mEq/L (ref 96–112)
Creatinine, Ser: 0.82 mg/dL (ref 0.40–1.20)
GFR: 72.6 mL/min (ref >60.00)
Glucose, Bld: 91 mg/dL (ref 70–99)
Potassium: 3.9 mEq/L (ref 3.5–5.1)
Sodium: 140 mEq/L (ref 135–145)
Total Bilirubin: 0.6 mg/dL (ref 0.2–1.2)
Total Protein: 6.6 g/dL (ref 6.0–8.3)

## 2018-03-03 LAB — LIPID PANEL
Cholesterol: 221 mg/dL — ABNORMAL HIGH (ref 0–200)
HDL: 74.6 mg/dL (ref >39.00)
LDL Cholesterol: 133 mg/dL — ABNORMAL HIGH (ref 0–99)
NonHDL: 146.18
Total CHOL/HDL Ratio: 3
Triglycerides: 67 mg/dL (ref 0.0–149.0)
VLDL: 13.4 mg/dL (ref 0.0–40.0)

## 2018-03-03 LAB — TSH: TSH: 0.76 u[IU]/mL (ref 0.35–4.50)

## 2018-03-03 LAB — VITAMIN D 25 HYDROXY (VIT D DEFICIENCY, FRACTURES): VITD: 53.02 ng/mL (ref 30.00–100.00)

## 2018-03-03 LAB — VITAMIN B12: Vitamin B-12: >1525 pg/mL — ABNORMAL HIGH (ref 211–911)

## 2018-03-03 NOTE — Assessment & Plan Note (Signed)
Taking calcium and vitamin D Very active Check vitamin D level DEXA due-ordered given known osteopenia

## 2018-03-03 NOTE — Assessment & Plan Note (Signed)
History of vitamin D being low Taking vitamin D/multivitamin Will check level given osteopenia

## 2018-03-03 NOTE — Assessment & Plan Note (Signed)
Does monthly B12 injections Last injection about 4 weeks ago Will check level

## 2018-03-08 ENCOUNTER — Ambulatory Visit (INDEPENDENT_AMBULATORY_CARE_PROVIDER_SITE_OTHER)
Admission: RE | Admit: 2018-03-08 | Discharge: 2018-03-08 | Disposition: A | Discharge status: Home / Self Care | Payer: 59 | Source: Ambulatory Visit | Attending: Internal Medicine | Admitting: Internal Medicine

## 2018-03-08 DIAGNOSIS — Z13820 Encounter for screening for osteoporosis: Secondary | ICD-10-CM

## 2018-03-08 DIAGNOSIS — M85852 Other specified disorders of bone density and structure, left thigh: Secondary | ICD-10-CM

## 2018-03-08 DIAGNOSIS — M85851 Other specified disorders of bone density and structure, right thigh: Secondary | ICD-10-CM | POA: Diagnosis not present

## 2018-08-28 ENCOUNTER — Other Ambulatory Visit: Payer: Self-pay | Admitting: Neurology

## 2018-08-28 DIAGNOSIS — G43821 Menstrual migraine, not intractable, with status migrainosus: Secondary | ICD-10-CM

## 2018-09-15 ENCOUNTER — Other Ambulatory Visit: Payer: Self-pay | Admitting: Neurology

## 2018-09-15 DIAGNOSIS — G43821 Menstrual migraine, not intractable, with status migrainosus: Secondary | ICD-10-CM

## 2018-10-28 ENCOUNTER — Other Ambulatory Visit: Payer: Self-pay | Admitting: Neurology

## 2018-10-28 DIAGNOSIS — G43821 Menstrual migraine, not intractable, with status migrainosus: Secondary | ICD-10-CM

## 2018-11-06 ENCOUNTER — Encounter: Payer: Self-pay | Admitting: Adult Health

## 2018-11-06 ENCOUNTER — Ambulatory Visit (INDEPENDENT_AMBULATORY_CARE_PROVIDER_SITE_OTHER): Payer: 59 | Admitting: Adult Health

## 2018-11-06 ENCOUNTER — Other Ambulatory Visit: Payer: Self-pay

## 2018-11-06 VITALS — BP 94/62 | HR 69 | Temp 97.8°F | Ht 65.0 in | Wt 140.8 lb

## 2018-11-06 DIAGNOSIS — G43821 Menstrual migraine, not intractable, with status migrainosus: Secondary | ICD-10-CM | POA: Diagnosis not present

## 2018-11-06 DIAGNOSIS — G43009 Migraine without aura, not intractable, without status migrainosus: Secondary | ICD-10-CM | POA: Diagnosis not present

## 2018-11-06 MED ORDER — SUMATRIPTAN SUCCINATE 100 MG PO TABS: ORAL_TABLET | ORAL | 5 refills | Status: DC

## 2018-11-06 NOTE — Patient Instructions (Addendum)
Your Plan:  Continue using Imitrex if needed.  If your symptoms worsen or you develop new symptoms please let us know.   Thank you for coming to see [place] at [facility]. I hope we have been able to provide you high quality care today.  You may receive a patient satisfaction survey over the next few weeks. We would appreciate your feedback and comments so that we may continue to improve ourselves and the health of our patients.

## 2018-11-06 NOTE — Progress Notes (Signed)
PATIENT: Kathleen Hays DOB: [birth year]  REASON FOR VISIT: follow up HISTORY FROM: patient  HISTORY OF PRESENT ILLNESS: Today 11/06/18:  Kathleen Hays is a 53 year old female with a history of migraine headaches.  She returns today for follow-up.  She states that her headache frequency was doing well until she began wearing mask.  She states that she was having to wear a mask and work outdoors during the heat.  She states that her headaches increased to at least once a week.  Imitrex continues to work well for her.  She states that since we have cooler weather her headaches have decreased again.  She has not had a migraine since September.  She also states that she has began having nosebleeds.  At least 3 times a week.  She has not seen her PCP or ENT for this.  She returns today for an evaluation.  HISTORY 04/27/17 Kathleen Hays is a 53 year old female with a history of migraine headaches.  She returns today for follow-up.  She reports that since January she has not had any headaches.  She reports that she eliminated peanuts and peanut butter from her diet and has since not had any headaches.  She reports that she has not had to use Imitrex.  Overall she states that she is doing well.  She returns today for evaluation.  REVIEW OF SYSTEMS: Out of a complete 14 system review of symptoms, the patient complains only of the following symptoms, and all other reviewed systems are negative.  See HPI  ALLERGIES: Allergies  Allergen Reactions  . Avelox [Moxifloxacin Hcl In Nacl]     HOME MEDICATIONS: Outpatient Medications Prior to Visit  Medication Sig Dispense Refill  . Misc Natural Products (TART CHERRY ADVANCED PO) Take 2 capsules by mouth daily.    . Multiple Vitamin (MULTIVITAMIN) tablet Take 1 tablet by mouth daily.    . naproxen sodium (ALEVE) 220 MG tablet Take 220 mg by mouth 2 (two) times daily as needed.    . SUMAtriptan (IMITREX) 100 MG tablet TAKE 1 TABLET AT ONSET OF  HEADACHE AND MAY REPEAT IN 2 HOURS IF PERSISTS OR RECURS MAX 2TABS/24HRS 10 tablet 0   No facility-administered medications prior to visit.     PAST MEDICAL HISTORY: Past Medical History:  Diagnosis Date  . Depression    PTSD secondary to witnessed father suicide (gunshot to chest)  . HA (headache)     PAST SURGICAL HISTORY: Past Surgical History:  Procedure Laterality Date  . CESAREAN SECTION    . MOUTH SURGERY    . TONSILLECTOMY AND ADENOIDECTOMY    . WISDOM TOOTH EXTRACTION Bilateral 1984   Patient was 53 years old.     FAMILY HISTORY: Family History  Problem Relation Age of Onset  . High blood pressure Mother   . Migraines Mother   . Thyroid disease Mother   . Depression Father   . Suicidality Father        gunshot  . Diabetes Maternal Grandfather   . Diabetes Paternal Grandmother     SOCIAL HISTORY: Social History   Socioeconomic History  . Marital status: Married    Spouse name: Coralyn Mark  . Number of children: 2  . Years of education: Therapist, sports  . Highest education level: Not on file  Occupational History  . Occupation: Facilities manager: [employer]  . Financial resource strain: Not on file  . Food insecurity    Worry: Not on  file    Inability: Not on file  . Transportation needs    Medical: Not on file    Non-medical: Not on file  Tobacco Use  . Smoking status: Former Smoker    Types: Cigarettes    Quit date: 2010    Years since quitting: 10.7  . Smokeless tobacco: Never Used  . Tobacco comment: Quit 2015  Substance and Sexual Activity  . Alcohol use: No    Alcohol/week: 0.0 standard drinks  . Drug use: No  . Sexual activity: Yes    Partners: Male    Birth control/protection: Other-see comments, Post-menopausal    Comment: Without menstral cycle for 3 year on January 2018  Lifestyle  . Physical activity    Days per week: Not on file    Minutes per session: Not on file  . Stress: Not on file  Relationships  . Social Wellsite geologist on phone: Not on file    Gets together: Not on file    Attends religious service: Not on file    Active member of club or organization: Not on file    Attends meetings of clubs or organizations: Not on file    Relationship status: Not on file  . Intimate partner violence    Fear of current or ex partner: Not on file    Emotionally abused: Not on file    Physically abused: Not on file    Forced sexual activity: Not on file  Other Topics Concern  . Not on file  Social History Narrative   Patient lives at home with her husband Kathleen Hays  and daughter.   Patient works at [employer]    Civil Service fast streamer.   Right handed.   Caffeine none.      PHYSICAL EXAM  Vitals:   11/06/18 0823  BP: 94/62  Pulse: 69  Temp: 97.8 F (36.6 C)  TempSrc: Oral  Weight: 140 lb 12.8 oz (63.9 kg)  Height: 5\' 5"  (1.651 m)   Body mass index is 23.43 kg/m.  Generalized: Well developed, in no acute distress   Neurological examination  Mentation: Alert oriented to time, place, history taking. Follows all commands speech and language fluent Cranial nerve II-XII: Pupils were equal round reactive to light. Extraocular movements were full, visual field were full on confrontational test.  Head turning and shoulder shrug  were normal and symmetric. Motor: The motor testing reveals 5 over 5 strength of all 4 extremities. Good symmetric motor tone is noted throughout.  Sensory: Sensory testing is intact to soft touch on all 4 extremities. No evidence of extinction is noted.  Coordination: Cerebellar testing reveals good finger-nose-finger and heel-to-shin bilaterally.  Gait and station: Gait is normal. Reflexes: Deep tendon reflexes are symmetric and normal bilaterally.   DIAGNOSTIC DATA (LABS, IMAGING, TESTING) - I reviewed patient records, labs, notes, testing and imaging myself where available.  Lab Results  Component Value Date   WBC 6.0 03/03/2018   HGB 15.4 (H) 03/03/2018    HCT 44.8 03/03/2018   MCV 88.4 03/03/2018   PLT 207.0 03/03/2018      Component Value Date/Time   NA 140 03/03/2018 0923   K 3.9 03/03/2018 0923   CL 103 03/03/2018 0923   CO2 26 03/03/2018 0923   GLUCOSE 91 03/03/2018 0923   BUN 17 03/03/2018 0923   CREATININE 0.82 03/03/2018 0923   CALCIUM 9.3 03/03/2018 0923   PROT 6.6 03/03/2018 0923   ALBUMIN 4.3 03/03/2018  0923   AST 18 03/03/2018 0923   ALT 16 03/03/2018 0923   ALKPHOS 44 03/03/2018 0923   BILITOT 0.6 03/03/2018 0923   Lab Results  Component Value Date   CHOL 221 (H) 03/03/2018   HDL 74.60 03/03/2018   LDLCALC 133 (H) 03/03/2018   TRIG 67.0 03/03/2018   CHOLHDL 3 03/03/2018   No results found for: HGBA1C Lab Results  Component Value Date   VITAMINB12 >1525 (H) 03/03/2018   Lab Results  Component Value Date   TSH 0.76 03/03/2018      ASSESSMENT AND PLAN 53 y.o. year old female  has a past medical history of Depression and HA (headache). here with:  1.  Migraine headache  The patient has remained stable.  Her headaches have gotten better with cold weather.  She will continue on Imitrex.  I have advised that if her headache worsens or she develops new symptoms she should let us know.  She will follow-up in 1 year or sooner if needed.   I spent 15 minutes with the patient. 50% of this time was spent reviewing plan of care   Butch PennyMegan , MSN, NP-C 11/06/2018, 7:58 AM [facility] [address], [address] [place], [place] [id number] [phone]

## 2018-11-22 ENCOUNTER — Telehealth: Payer: Self-pay | Admitting: Internal Medicine

## 2018-11-22 NOTE — Telephone Encounter (Signed)
Patient has received her flu shot, Please add this to her chart. Thank you.

## 2018-11-22 NOTE — Telephone Encounter (Signed)
Documented

## 2018-12-07 ENCOUNTER — Other Ambulatory Visit: Payer: 59

## 2018-12-07 ENCOUNTER — Encounter: Payer: Self-pay | Admitting: Internal Medicine

## 2018-12-07 ENCOUNTER — Ambulatory Visit (INDEPENDENT_AMBULATORY_CARE_PROVIDER_SITE_OTHER): Payer: 59 | Admitting: Internal Medicine

## 2018-12-07 ENCOUNTER — Other Ambulatory Visit: Payer: Self-pay

## 2018-12-07 DIAGNOSIS — N3001 Acute cystitis with hematuria: Secondary | ICD-10-CM | POA: Diagnosis not present

## 2018-12-07 DIAGNOSIS — R310 Gross hematuria: Secondary | ICD-10-CM | POA: Diagnosis not present

## 2018-12-07 DIAGNOSIS — R300 Dysuria: Secondary | ICD-10-CM

## 2018-12-07 LAB — POC URINALSYSI DIPSTICK (AUTOMATED)
Bilirubin, UA: NEGATIVE
Glucose, UA: NEGATIVE
Ketones, UA: NEGATIVE
Nitrite, UA: NEGATIVE
Protein, UA: POSITIVE — AB
Spec Grav, UA: 1.01 (ref 1.010–1.025)
Urobilinogen, UA: 0.2 E.U./dL
pH, UA: 6.5 (ref 5.0–8.0)

## 2018-12-07 MED ORDER — NITROFURANTOIN MONOHYD MACRO 100 MG PO CAPS: 100.0000 mg | ORAL_CAPSULE | Freq: Two times a day (BID) | ORAL | 0 refills | Status: DC

## 2018-12-07 NOTE — Progress Notes (Signed)
Virtual Visit via Video Note  I connected with Kathleen Hays on 12/07/18 at 10:45 AM EST by a video enabled telemedicine application and verified that I am speaking with the correct person using two identifiers.   I discussed the limitations of evaluation and management by telemedicine and the availability of in person appointments. The patient expressed understanding and agreed to proceed.  Present for the visit:  Myself, Dr Billey Gosling, Su Hilt.  The patient is currently at work and I am at home.  No referring provider.    History of Present Illness: This is an acute visit for blood in the urine.  This morning she woke up and had blood in her urine.  She has not had any dysuria or other urinary symptoms.  Yesterday she did have some left flank pain, but thought it was muscular skeletal from picking up her grandkids.  She has not had any fever, nausea or abdominal pain.  Her last UTI was 2018.  She denies a history for herself or her family of kidney stones.   Social History   Socioeconomic History  . Marital status: Married    Spouse name: Coralyn Mark  . Number of children: 2  . Years of education: Therapist, sports  . Highest education level: Not on file  Occupational History  . Occupation: Facilities manager: [employer]  . Financial resource strain: Not on file  . Food insecurity    Worry: Not on file    Inability: Not on file  . Transportation needs    Medical: Not on file    Non-medical: Not on file  Tobacco Use  . Smoking status: Former Smoker    Types: Cigarettes    Quit date: 2010    Years since quitting: 10.8  . Smokeless tobacco: Never Used  . Tobacco comment: Quit 2015  Substance and Sexual Activity  . Alcohol use: No    Alcohol/week: 0.0 standard drinks  . Drug use: No  . Sexual activity: Yes    Partners: Male    Birth control/protection: Other-see comments, Post-menopausal    Comment: Without menstral cycle for 3 year on January 2018  Lifestyle   . Physical activity    Days per week: Not on file    Minutes per session: Not on file  . Stress: Not on file  Relationships  . Social Herbalist on phone: Not on file    Gets together: Not on file    Attends religious service: Not on file    Active member of club or organization: Not on file    Attends meetings of clubs or organizations: Not on file    Relationship status: Not on file  Other Topics Concern  . Not on file  Social History Narrative   Patient lives at home with her husband Coralyn Mark  and daughter.   Patient works at [employer] and [employer].    Psychologist, clinical.   Right handed.   Caffeine none.     Observations/Objective: Appears well in NAD  Urine dip shows small leukocytes, blood and protein  Assessment and Plan:  See Problem List for Assessment and Plan of chronic medical problems.   Follow Up Instructions:    I discussed the assessment and treatment plan with the patient. The patient was provided an opportunity to ask questions and all were answered. The patient agreed with the plan and demonstrated an understanding of the instructions.   The patient  was advised to call back or seek an in-person evaluation if the symptoms worsen or if the condition fails to improve as anticipated.    Binnie Rail, MD

## 2018-12-07 NOTE — Assessment & Plan Note (Signed)
Urine dip consistent with UTI, but she is also had left flank pain yesterday, which could be related to the UTI or possibly nephrolithiasis Will send urine for culture Will treat empirically for UTI If symptoms do not improve may need imaging to evaluate for kidney stone Take the antibiotic as prescribed.   Take tylenol if needed.   Increase your water intake.  Call if no improvement

## 2018-12-08 LAB — URINE CULTURE
MICRO NUMBER:: 1093776
SPECIMEN QUALITY:: ADEQUATE

## 2018-12-11 ENCOUNTER — Telehealth: Payer: Self-pay

## 2018-12-11 MED ORDER — AMOXICILLIN-POT CLAVULANATE 875-125 MG PO TABS: 1.0000 | ORAL_TABLET | Freq: Two times a day (BID) | ORAL | 0 refills | Status: DC

## 2018-12-11 NOTE — Telephone Encounter (Signed)
Pt states that macrobid has been causing headaches since taking it as well as fatigue. Can something else be sent in?

## 2018-12-11 NOTE — Telephone Encounter (Signed)
Pt aware.

## 2018-12-11 NOTE — Telephone Encounter (Signed)
augmentin sent to [facility].

## 2018-12-20 ENCOUNTER — Other Ambulatory Visit: Payer: Self-pay | Admitting: Internal Medicine

## 2018-12-20 ENCOUNTER — Encounter: Payer: Self-pay | Admitting: Internal Medicine

## 2018-12-20 DIAGNOSIS — R82998 Other abnormal findings in urine: Secondary | ICD-10-CM

## 2018-12-20 MED ORDER — CEPHALEXIN 500 MG PO CAPS: 500.0000 mg | ORAL_CAPSULE | Freq: Two times a day (BID) | ORAL | 0 refills | Status: DC

## 2019-01-18 ENCOUNTER — Other Ambulatory Visit: Payer: Self-pay | Admitting: *Deleted

## 2019-01-18 ENCOUNTER — Telehealth: Payer: Self-pay | Admitting: Internal Medicine

## 2019-01-18 ENCOUNTER — Other Ambulatory Visit: Payer: Self-pay | Admitting: Internal Medicine

## 2019-01-18 ENCOUNTER — Other Ambulatory Visit: Payer: Self-pay

## 2019-01-18 ENCOUNTER — Other Ambulatory Visit (INDEPENDENT_AMBULATORY_CARE_PROVIDER_SITE_OTHER): Payer: 59

## 2019-01-18 DIAGNOSIS — R300 Dysuria: Secondary | ICD-10-CM | POA: Diagnosis not present

## 2019-01-18 LAB — URINALYSIS, ROUTINE W REFLEX MICROSCOPIC
Bilirubin Urine: NEGATIVE
Ketones, ur: NEGATIVE
Leukocytes,Ua: NEGATIVE
Nitrite: NEGATIVE
Specific Gravity, Urine: <=1.005 — AB (ref 1.000–1.030)
Total Protein, Urine: NEGATIVE
Urine Glucose: NEGATIVE
Urobilinogen, UA: 0.2 (ref 0.0–1.0)
pH: 6 (ref 5.0–8.0)

## 2019-01-18 MED ORDER — CEPHALEXIN 500 MG PO CAPS: 500.0000 mg | ORAL_CAPSULE | Freq: Two times a day (BID) | ORAL | 0 refills | Status: DC

## 2019-01-18 NOTE — Telephone Encounter (Signed)
Patient was seen by Dr. Quay Burow a couple weeks ago for UTI.  She was given two antibiotics that she had an allergic reaction to.  Finally was placed on cephalexin 500mg  twice daily.  Medication has been completed.  Symptoms have improved but patient is still having some discomfort and urine is slightly cloudy.  Dr. Quay Burow had suggested to repeat urinalysis in the office.  Dr Quay Burow is out today.  Patient would like to know if you would be able to order this for her?

## 2019-01-18 NOTE — Telephone Encounter (Signed)
Placed.

## 2019-01-19 LAB — URINE CULTURE: Result:: NO GROWTH

## 2019-02-09 ENCOUNTER — Other Ambulatory Visit (INDEPENDENT_AMBULATORY_CARE_PROVIDER_SITE_OTHER): Payer: 59 | Admitting: Internal Medicine

## 2019-02-09 ENCOUNTER — Telehealth: Payer: Self-pay

## 2019-02-09 DIAGNOSIS — R82998 Other abnormal findings in urine: Secondary | ICD-10-CM

## 2019-02-09 DIAGNOSIS — R310 Gross hematuria: Secondary | ICD-10-CM

## 2019-02-09 LAB — URINALYSIS, ROUTINE W REFLEX MICROSCOPIC
Bilirubin Urine: NEGATIVE
Ketones, ur: NEGATIVE
Nitrite: NEGATIVE
Specific Gravity, Urine: 1.01 (ref 1.000–1.030)
Urine Glucose: NEGATIVE
Urobilinogen, UA: 0.2 (ref 0.0–1.0)
pH: 6 (ref 5.0–8.0)

## 2019-02-09 MED ORDER — FOSFOMYCIN TROMETHAMINE 3 G PO PACK: 3.0000 g | PACK | Freq: Once | ORAL | 0 refills | Status: AC

## 2019-02-09 NOTE — Telephone Encounter (Signed)
Pt stated clindamycin, my apologizes. The abx cephalexin was from [place]. I will send the urine for culture. Order has been entered.

## 2019-02-09 NOTE — Telephone Encounter (Addendum)
Patient contacted office and stated that she has 3 pills of the clindamycin left and her urine still looks there is blood in it. She has been taking them once daily instead of twice due to diarrhea side effect.   Urine dip showed: a large amount of blood.   Please advise want or how to continue or if you ned to see patient.

## 2019-02-09 NOTE — Telephone Encounter (Signed)
We need to send the urine for a culture.   Where is the clindamycin from?

## 2019-02-09 NOTE — Telephone Encounter (Signed)
Fosfomycin sent

## 2019-02-09 NOTE — Telephone Encounter (Signed)
Pt informed - per PCP - fosfomycin sent to pharm. If it is not covered, okay to give 500 mg ceftriaxone in office on nurse visit. Pt to take stool culture kit home in case diarrhea continues. Pt advise of above and to also start a probiotic. Pt also educated that the dosing per day of antibiotic is important to completely kill the bacteria colony and not completing could cause them to become resistant. Pt stated understanding and will stop the cephalexin and will let me know about the fosfomycin vs ceftriaxone.

## 2019-02-09 NOTE — Telephone Encounter (Signed)
Pt stated that she would do whichever one you advised.   Ceftriaxone is in the office. We don't have the fosfomycin in office.   Please advise.    Pt agreed to come in next week to recheck urine next week.

## 2019-02-09 NOTE — Telephone Encounter (Signed)
Stop cephalexin if it is causing nausea.   Can do fosfomycin or ceftriaxone injection - I think we should try the one dose of fosfomycin -- has she taken that?    No matter what we do she should have her urine rechecked next week

## 2019-02-10 LAB — URINE CULTURE: Result:: NO GROWTH

## 2019-06-12 NOTE — Patient Instructions (Addendum)
Blood work was ordered.    All other Health Maintenance issues reviewed.   All recommended immunizations and age-appropriate screenings are up-to-date or discussed.  No immunization administered today.   Medications reviewed and updated.  Changes include :   none     Please followup in 1 year    Health Maintenance, Female Adopting a healthy lifestyle and getting preventive care are important in promoting health and wellness. Ask your health care provider about:  The right schedule for you to have regular tests and exams.  Things you can do on your own to prevent diseases and keep yourself healthy. What should I know about diet, weight, and exercise? Eat a healthy diet   Eat a diet that includes plenty of vegetables, fruits, low-fat dairy products, and lean protein.  Do not eat a lot of foods that are high in solid fats, added sugars, or sodium. Maintain a healthy weight Body mass index (BMI) is used to identify weight problems. It estimates body fat based on height and weight. Your health care provider can help determine your BMI and help you achieve or maintain a healthy weight. Get regular exercise Get regular exercise. This is one of the most important things you can do for your health. Most adults should:  Exercise for at least 150 minutes each week. The exercise should increase your heart rate and make you sweat (moderate-intensity exercise).  Do strengthening exercises at least twice a week. This is in addition to the moderate-intensity exercise.  Spend less time sitting. Even light physical activity can be beneficial. Watch cholesterol and blood lipids Have your blood tested for lipids and cholesterol at 56 years of age, then have this test every 5 years. Have your cholesterol levels checked more often if:  Your lipid or cholesterol levels are high.  You are older than 56 years of age.  You are at high risk for heart disease. What should I know about cancer  screening? Depending on your health history and family history, you may need to have cancer screening at various ages. This may include screening for:  Breast cancer.  Cervical cancer.  Colorectal cancer.  Skin cancer.  Lung cancer. What should I know about heart disease, diabetes, and high blood pressure? Blood pressure and heart disease  High blood pressure causes heart disease and increases the risk of stroke. This is more likely to develop in people who have high blood pressure readings, are of African descent, or are overweight.  Have your blood pressure checked: ? Every 3-5 years if you are 18-39 years of age. ? Every year if you are 40 years old or older. Diabetes Have regular diabetes screenings. This checks your fasting blood sugar level. Have the screening done:  Once every three years after age 40 if you are at a normal weight and have a low risk for diabetes.  More often and at a younger age if you are overweight or have a high risk for diabetes. What should I know about preventing infection? Hepatitis B If you have a higher risk for hepatitis B, you should be screened for this virus. Talk with your health care provider to find out if you are at risk for hepatitis B infection. Hepatitis C Testing is recommended for:  Everyone born from 1945 through 1965.  Anyone with known risk factors for hepatitis C. Sexually transmitted infections (STIs)  Get screened for STIs, including gonorrhea and chlamydia, if: ? You are sexually active and are younger than 56 years   of age. ? You are older than 56 years of age and your health care provider tells you that you are at risk for this type of infection. ? Your sexual activity has changed since you were last screened, and you are at increased risk for chlamydia or gonorrhea. Ask your health care provider if you are at risk.  Ask your health care provider about whether you are at high risk for HIV. Your health care provider may  recommend a prescription medicine to help prevent HIV infection. If you choose to take medicine to prevent HIV, you should first get tested for HIV. You should then be tested every 3 months for as long as you are taking the medicine. Pregnancy  If you are about to stop having your period (premenopausal) and you may become pregnant, seek counseling before you get pregnant.  Take 400 to 800 micrograms (mcg) of folic acid every day if you become pregnant.  Ask for birth control (contraception) if you want to prevent pregnancy. Osteoporosis and menopause Osteoporosis is a disease in which the bones lose minerals and strength with aging. This can result in bone fractures. If you are 65 years old or older, or if you are at risk for osteoporosis and fractures, ask your health care provider if you should:  Be screened for bone loss.  Take a calcium or vitamin D supplement to lower your risk of fractures.  Be given hormone replacement therapy (HRT) to treat symptoms of menopause. Follow these instructions at home: Lifestyle  Do not use any products that contain nicotine or tobacco, such as cigarettes, e-cigarettes, and chewing tobacco. If you need help quitting, ask your health care provider.  Do not use street drugs.  Do not share needles.  Ask your health care provider for help if you need support or information about quitting drugs. Alcohol use  Do not drink alcohol if: ? Your health care provider tells you not to drink. ? You are pregnant, may be pregnant, or are planning to become pregnant.  If you drink alcohol: ? Limit how much you use to 0-1 drink a day. ? Limit intake if you are breastfeeding.  Be aware of how much alcohol is in your drink. In the [place] one drink equals one 12 oz bottle of beer (355 mL), one 5 oz glass of wine (148 mL), or one 1 oz glass of hard liquor (44 mL). General instructions  Schedule regular health, dental, and eye exams.  Stay current with your  vaccines.  Tell your health care provider if: ? You often feel depressed. ? You have ever been abused or do not feel safe at home. Summary  Adopting a healthy lifestyle and getting preventive care are important in promoting health and wellness.  Follow your health care provider's instructions about healthy diet, exercising, and getting tested or screened for diseases.  Follow your health care provider's instructions on monitoring your cholesterol and blood pressure. This information is not intended to replace advice given to you by your health care provider. Make sure you discuss any questions you have with your health care provider. Document Revised: 01/04/2018 Document Reviewed: 01/04/2018 Elsevier Patient Education  2020 Elsevier Inc.  

## 2019-06-12 NOTE — Progress Notes (Signed)
Subjective:    Patient ID: Kathleen Hays, female    DOB: [birth year], 54 y.o.   MRN: [id number]  HPI She is here for a physical exam.   She denies changes in her history and has no concerns.   Medications and allergies reviewed with patient and updated if appropriate.  Patient Active Problem List   Diagnosis Date Noted  . Acute cystitis with hematuria 12/07/2018  . Vitamin D deficiency 03/03/2018  . Low vitamin B12 level 09/08/2017  . Osteopenia 09/08/2017  . Family history of pernicious anemia 03/02/2017  . Hx of anorexia nervosa 01/23/2016  . History of colonic polyps 01/23/2016  . Migraines     Current Outpatient Medications on File Prior to Visit  Medication Sig Dispense Refill  . Misc Natural Products (TART CHERRY ADVANCED PO) Take 2 capsules by mouth daily.    . Multiple Vitamin (MULTIVITAMIN) tablet Take 1 tablet by mouth daily.    . naproxen sodium (ALEVE) 220 MG tablet Take 220 mg by mouth 2 (two) times daily as needed.    . SUMAtriptan (IMITREX) 100 MG tablet Take 1 tablet at the onset of migraine. May repeat in 2 hours if headache persists or recurs. Not to exceed 2tabs/24 hours. 10 tablet 5   No current facility-administered medications on file prior to visit.    Past Medical History:  Diagnosis Date  . Depression    PTSD secondary to witnessed father suicide (gunshot to chest)  . HA (headache)     Past Surgical History:  Procedure Laterality Date  . CESAREAN SECTION    . MOUTH SURGERY    . TONSILLECTOMY AND ADENOIDECTOMY    . WISDOM TOOTH EXTRACTION Bilateral 1984   Patient was 54 years old.     Social History   Socioeconomic History  . Marital status: Married    Spouse name: Coralyn Mark  . Number of children: 2  . Years of education: Therapist, sports  . Highest education level: Not on file  Occupational History  . Occupation: Facilities manager: [employer]  Tobacco Use  . Smoking status: Former Smoker    Types: Cigarettes    Quit date: 2010    Years  since quitting: 11.3  . Smokeless tobacco: Never Used  . Tobacco comment: Quit 2015  Substance and Sexual Activity  . Alcohol use: No    Alcohol/week: 0.0 standard drinks  . Drug use: No  . Sexual activity: Yes    Partners: Male    Birth control/protection: Other-see comments, Post-menopausal    Comment: Without menstral cycle for 3 year on January 2018  Other Topics Concern  . Not on file  Social History Narrative   Patient lives at home with her husband Coralyn Mark  and daughter.   Patient works at [employer] and [employer].    Psychologist, clinical.   Right handed.   Caffeine none.   Social Determinants of Health   Financial Resource Strain:   . Difficulty of Paying Living Expenses:   Food Insecurity:   . Worried About Charity fundraiser in the Last Year:   . Arboriculturist in the Last Year:   Transportation Needs:   . Film/video editor (Medical):   Marland Kitchen Lack of Transportation (Non-Medical):   Physical Activity:   . Days of Exercise per Week:   . Minutes of Exercise per Session:   Stress:   . Feeling of Stress :   Social Connections:   . Frequency  of Communication with Friends and Family:   . Frequency of Social Gatherings with Friends and Family:   . Attends Religious Services:   . Active Member of Clubs or Organizations:   . Attends Banker Meetings:   Marland Kitchen Marital Status:     Family History  Problem Relation Age of Onset  . High blood pressure Mother   . Migraines Mother   . Thyroid disease Mother   . Depression Father   . Suicidality Father        gunshot  . Diabetes Maternal Grandfather   . Diabetes Paternal Grandmother     Review of Systems  Constitutional: Negative for chills and fever.  HENT: Positive for postnasal drip. Negative for sore throat.   Eyes: Negative for visual disturbance.  Respiratory: Negative for cough, shortness of breath and wheezing.   Cardiovascular: Negative for chest pain, palpitations and leg swelling.    Gastrointestinal: Negative for abdominal pain, blood in stool, constipation, diarrhea and nausea.       No gerd  Genitourinary: Negative for dysuria and hematuria.  Musculoskeletal: Negative for arthralgias and back pain.  Skin: Negative for color change and rash.  Neurological: Positive for headaches (migraines). Negative for dizziness and light-headedness.  Psychiatric/Behavioral: Negative for dysphoric mood. The patient is not nervous/anxious.        Objective:   Vitals:   06/13/19 1331  BP: 102/70  Pulse: 62  Resp: 16  Temp: 98.8 F (37.1 C)  SpO2: 98%   Filed Weights   06/13/19 1331  Weight: 130 lb (59 kg)   Body mass index is 21.63 kg/m.  BP Readings from Last 3 Encounters:  06/13/19 102/70  11/06/18 94/62  03/03/18 104/72    Wt Readings from Last 3 Encounters:  06/13/19 130 lb (59 kg)  11/06/18 140 lb 12.8 oz (63.9 kg)  03/03/18 135 lb (61.2 kg)     Physical Exam Constitutional: She appears well-developed and well-nourished. No distress.  HENT:  Head: Normocephalic and atraumatic.  Right Ear: External ear normal. Normal ear canal and TM Left Ear: External ear normal.  Normal ear canal and TM Mouth/Throat: Oropharynx is clear and moist.  Eyes: Conjunctivae and EOM are normal.  Neck: Neck supple. No tracheal deviation present. No thyromegaly present.  No carotid bruit  Cardiovascular: Normal rate, regular rhythm and normal heart sounds.   No murmur heard.  No edema. Pulmonary/Chest: Effort normal and breath sounds normal. No respiratory distress. She has no wheezes. She has no rales.  Breast: deferred   Abdominal: Soft. She exhibits no distension. There is no tenderness.  Lymphadenopathy: She has no cervical adenopathy.  Skin: Skin is warm and dry. She is not diaphoretic.  Psychiatric: She has a normal mood and affect. Her behavior is normal.        Assessment & Plan:   Physical exam: Screening blood work    ordered Immunizations  Discussed  vaccines she is eligible for Colonoscopy   deferred Mammogram  deferred Gyn  deferred Dexa  Up to date  Eye exams   Up to date Exercise  Very active Weight  BMI normal Substance abuse  none  See Problem List for Assessment and Plan of chronic medical problems.    This visit occurred during the SARS-CoV-2 public health emergency.  Safety protocols were in place, including screening questions prior to the visit, additional usage of staff PPE, and extensive cleaning of exam room while observing appropriate contact time as indicated for disinfecting solutions.

## 2019-06-13 ENCOUNTER — Ambulatory Visit (INDEPENDENT_AMBULATORY_CARE_PROVIDER_SITE_OTHER): Payer: 59 | Admitting: Internal Medicine

## 2019-06-13 ENCOUNTER — Encounter: Payer: Self-pay | Admitting: Internal Medicine

## 2019-06-13 ENCOUNTER — Other Ambulatory Visit: Payer: Self-pay

## 2019-06-13 VITALS — BP 102/70 | HR 62 | Temp 98.8°F | Resp 16 | Ht 65.0 in | Wt 130.0 lb

## 2019-06-13 DIAGNOSIS — M85852 Other specified disorders of bone density and structure, left thigh: Secondary | ICD-10-CM

## 2019-06-13 DIAGNOSIS — E538 Deficiency of other specified B group vitamins: Secondary | ICD-10-CM | POA: Diagnosis not present

## 2019-06-13 DIAGNOSIS — Z0000 Encounter for general adult medical examination without abnormal findings: Secondary | ICD-10-CM

## 2019-06-13 DIAGNOSIS — M85851 Other specified disorders of bone density and structure, right thigh: Secondary | ICD-10-CM | POA: Diagnosis not present

## 2019-06-13 DIAGNOSIS — E559 Vitamin D deficiency, unspecified: Secondary | ICD-10-CM

## 2019-06-13 DIAGNOSIS — G43809 Other migraine, not intractable, without status migrainosus: Secondary | ICD-10-CM

## 2019-06-13 LAB — COMPREHENSIVE METABOLIC PANEL
ALT: 18 U/L (ref 0–35)
AST: 20 U/L (ref 0–37)
Albumin: 4.5 g/dL (ref 3.5–5.2)
Alkaline Phosphatase: 43 U/L (ref 39–117)
BUN: 14 mg/dL (ref 6–23)
CO2: 30 mEq/L (ref 19–32)
Calcium: 9.8 mg/dL (ref 8.4–10.5)
Chloride: 101 mEq/L (ref 96–112)
Creatinine, Ser: 0.78 mg/dL (ref 0.40–1.20)
GFR: 76.55 mL/min (ref >60.00)
Glucose, Bld: 81 mg/dL (ref 70–99)
Potassium: 3.9 mEq/L (ref 3.5–5.1)
Sodium: 140 mEq/L (ref 135–145)
Total Bilirubin: 0.6 mg/dL (ref 0.2–1.2)
Total Protein: 6.7 g/dL (ref 6.0–8.3)

## 2019-06-13 LAB — LIPID PANEL
Cholesterol: 226 mg/dL — ABNORMAL HIGH (ref 0–200)
HDL: 89.4 mg/dL (ref >39.00)
LDL Cholesterol: 121 mg/dL — ABNORMAL HIGH (ref 0–99)
NonHDL: 136.99
Total CHOL/HDL Ratio: 3
Triglycerides: 80 mg/dL (ref 0.0–149.0)
VLDL: 16 mg/dL (ref 0.0–40.0)

## 2019-06-13 LAB — CBC WITH DIFFERENTIAL/PLATELET
Basophils Absolute: 0 10*3/uL (ref 0.0–0.1)
Basophils Relative: 0.6 % (ref 0.0–3.0)
Eosinophils Absolute: 0.1 10*3/uL (ref 0.0–0.7)
Eosinophils Relative: 1.2 % (ref 0.0–5.0)
HCT: 46.1 % — ABNORMAL HIGH (ref 36.0–46.0)
Hemoglobin: 15.6 g/dL — ABNORMAL HIGH (ref 12.0–15.0)
Lymphocytes Relative: 31.3 % (ref 12.0–46.0)
Lymphs Abs: 2 10*3/uL (ref 0.7–4.0)
MCHC: 33.8 g/dL (ref 30.0–36.0)
MCV: 90.4 fl (ref 78.0–100.0)
Monocytes Absolute: 0.3 10*3/uL (ref 0.1–1.0)
Monocytes Relative: 4.9 % (ref 3.0–12.0)
Neutro Abs: 4 10*3/uL (ref 1.4–7.7)
Neutrophils Relative %: 62 % (ref 43.0–77.0)
Platelets: 207 10*3/uL (ref 150.0–400.0)
RBC: 5.1 Mil/uL (ref 3.87–5.11)
RDW: 14 % (ref 11.5–15.5)
WBC: 6.4 10*3/uL (ref 4.0–10.5)

## 2019-06-13 LAB — VITAMIN B12: Vitamin B-12: 595 pg/mL (ref 211–911)

## 2019-06-13 LAB — TSH: TSH: 1.05 u[IU]/mL (ref 0.35–4.50)

## 2019-06-13 LAB — VITAMIN D 25 HYDROXY (VIT D DEFICIENCY, FRACTURES): VITD: 56.15 ng/mL (ref 30.00–100.00)

## 2019-06-13 NOTE — Assessment & Plan Note (Signed)
Chronic H/o low B12 Will check level

## 2019-06-13 NOTE — Assessment & Plan Note (Signed)
Chronic Following with neurology Takes imitrex every other month - effective Overall controlled

## 2019-06-13 NOTE — Assessment & Plan Note (Signed)
Chronic dexa up to date - repeat next year Very active Taking MVI

## 2019-06-13 NOTE — Assessment & Plan Note (Signed)
Chronic Taking MVI Check level

## 2019-06-30 ENCOUNTER — Other Ambulatory Visit: Payer: Self-pay | Admitting: Adult Health

## 2019-06-30 DIAGNOSIS — G43821 Menstrual migraine, not intractable, with status migrainosus: Secondary | ICD-10-CM

## 2019-11-08 ENCOUNTER — Ambulatory Visit: Payer: 59 | Admitting: Adult Health

## 2019-11-13 ENCOUNTER — Ambulatory Visit: Payer: 59 | Admitting: Adult Health

## 2019-11-18 NOTE — Progress Notes (Signed)
PATIENT: Kathleen Hays DOB: [birth year]  REASON FOR VISIT: follow up HISTORY FROM: patient  HISTORY OF PRESENT ILLNESS: Today 11/18/19:  Ms. Kathleen Hays is a 54 year old female with a history of migraine headaches. She returns today for follow-up. Overall she feels that she is doing well. She continues to only use imitrex when she gets a headache. She has approximately 1 headache every 2 months.  She feels that some of her headaches are related to her allergies.  She is unable to take allergy medication as it causes nosebleeds.  The patient also mentions that her mom and grandmother had Alzheimer's disease.  She has questions about whether she will also develop this.  She does not currently have any issues with her memory.  HISTORY 11/06/18:  Ms. Kathleen Hays is a 54 year old female with a history of migraine headaches.  She returns today for follow-up.  She states that her headache frequency was doing well until she began wearing mask.  She states that she was having to wear a mask and work outdoors during the heat.  She states that her headaches increased to at least once a week.  Imitrex continues to work well for her.  She states that since we have cooler weather her headaches have decreased again.  She has not had a migraine since September.  She also states that she has began having nosebleeds.  At least 3 times a week.  She has not seen her PCP or ENT for this.  She returns today for an evaluation.  REVIEW OF SYSTEMS: Out of a complete 14 system review of symptoms, the patient complains only of the following symptoms, and all other reviewed systems are negative.  See HPI  ALLERGIES: Allergies  Allergen Reactions  . Macrobid [Nitrofurantoin Macrocrystal] Other (See Comments)    Cause severe migraines and vomiting  . Avelox [Moxifloxacin Hcl In Nacl] Swelling  . Augmentin [Amoxicillin-Pot Clavulanate]     nausea    HOME MEDICATIONS: Outpatient Medications Prior to Visit    Medication Sig Dispense Refill  . Misc Natural Products (TART CHERRY ADVANCED PO) Take 2 capsules by mouth daily.    . Multiple Vitamin (MULTIVITAMIN) tablet Take 1 tablet by mouth daily.    . naproxen sodium (ALEVE) 220 MG tablet Take 220 mg by mouth 2 (two) times daily as needed.    . SUMAtriptan (IMITREX) 100 MG tablet TAKE 1 TABLET AT THE ONSET OF MIGRAINE. MAY REPEAT IN 2 HOURS IF HEADACHE PERSISTS OR RECURS. NOT TO EXCEED 2TABS/24 HOURS. 10 tablet 3   No facility-administered medications prior to visit.    PAST MEDICAL HISTORY: Past Medical History:  Diagnosis Date  . Depression    PTSD secondary to witnessed father suicide (gunshot to chest)  . HA (headache)     PAST SURGICAL HISTORY: Past Surgical History:  Procedure Laterality Date  . CESAREAN SECTION    . MOUTH SURGERY    . TONSILLECTOMY AND ADENOIDECTOMY    . WISDOM TOOTH EXTRACTION Bilateral 1984   Patient was 55 years old.     FAMILY HISTORY: Family History  Problem Relation Age of Onset  . High blood pressure Mother   . Migraines Mother   . Thyroid disease Mother   . Depression Father   . Suicidality Father        gunshot  . Diabetes Maternal Grandfather   . Diabetes Paternal Grandmother     SOCIAL HISTORY: Social History   Socioeconomic History  . Marital status: Married  Spouse name: [name]  . Number of children: 2  . Years of education: Charity fundraiser  . Highest education level: Not on file  Occupational History  . Occupation: Engineering geologist: [employer]  Tobacco Use  . Smoking status: Former Smoker    Types: Cigarettes    Quit date: 2010    Years since quitting: 11.8  . Smokeless tobacco: Never Used  . Tobacco comment: Quit 2015  Substance and Sexual Activity  . Alcohol use: No    Alcohol/week: 0.0 standard drinks  . Drug use: No  . Sexual activity: Yes    Partners: Male    Birth control/protection: Other-see comments, Post-menopausal    Comment: Without menstral cycle for 3 year on January  2018  Other Topics Concern  . Not on file  Social History Narrative   Patient lives at home with her husband [name]  and daughter.   Patient works at [employer]    Civil Service fast streamer.   Right handed.   Caffeine none.   Social Determinants of Health   Financial Resource Strain:   . Difficulty of Paying Living Expenses: Not on file  Food Insecurity:   . Worried About Programme researcher, broadcasting/film/video in the Last Year: Not on file  . Ran Out of Food in the Last Year: Not on file  Transportation Needs:   . Lack of Transportation (Medical): Not on file  . Lack of Transportation (Non-Medical): Not on file  Physical Activity:   . Days of Exercise per Week: Not on file  . Minutes of Exercise per Session: Not on file  Stress:   . Feeling of Stress : Not on file  Social Connections:   . Frequency of Communication with Friends and Family: Not on file  . Frequency of Social Gatherings with Friends and Family: Not on file  . Attends Religious Services: Not on file  . Active Member of Clubs or Organizations: Not on file  . Attends Banker Meetings: Not on file  . Marital Status: Not on file  Intimate Partner Violence:   . Fear of Current or Ex-Partner: Not on file  . Emotionally Abused: Not on file  . Physically Abused: Not on file  . Sexually Abused: Not on file      PHYSICAL EXAM  Vitals:   11/19/19 0824  BP: 116/72  Pulse: 63  Weight: 134 lb (60.8 kg)  Height: 5\' 5"  (1.651 m)   Body mass index is 22.3 kg/m.  Generalized: Well developed, in no acute distress   Neurological examination  Mentation: Alert oriented to time, place, history taking. Follows all commands speech and language fluent Cranial nerve II-XII: Pupils were equal round reactive to light. Extraocular movements were full, visual field were full on confrontational test. Head turning and shoulder shrug  were normal and symmetric. Motor: The motor testing reveals 5 over 5 strength of all 4  extremities. Good symmetric motor tone is noted throughout.  Sensory: Sensory testing is intact to soft touch on all 4 extremities. No evidence of extinction is noted.  Coordination: Cerebellar testing reveals good finger-nose-finger and heel-to-shin bilaterally.  Gait and station: Gait is normal.  Reflexes: Deep tendon reflexes are symmetric and normal bilaterally.   DIAGNOSTIC DATA (LABS, IMAGING, TESTING) - I reviewed patient records, labs, notes, testing and imaging myself where available.  Lab Results  Component Value Date   WBC 6.4 06/13/2019   HGB 15.6 (H) 06/13/2019   HCT 46.1 (H)  06/13/2019   MCV 90.4 06/13/2019   PLT 207.0 06/13/2019      Component Value Date/Time   NA 140 06/13/2019 1444   K 3.9 06/13/2019 1444   CL 101 06/13/2019 1444   CO2 30 06/13/2019 1444   GLUCOSE 81 06/13/2019 1444   BUN 14 06/13/2019 1444   CREATININE 0.78 06/13/2019 1444   CALCIUM 9.8 06/13/2019 1444   PROT 6.7 06/13/2019 1444   ALBUMIN 4.5 06/13/2019 1444   AST 20 06/13/2019 1444   ALT 18 06/13/2019 1444   ALKPHOS 43 06/13/2019 1444   BILITOT 0.6 06/13/2019 1444   Lab Results  Component Value Date   CHOL 226 (H) 06/13/2019   HDL 89.40 06/13/2019   LDLCALC 121 (H) 06/13/2019   TRIG 80.0 06/13/2019   CHOLHDL 3 06/13/2019   No results found for: HGBA1C Lab Results  Component Value Date   VITAMINB12 595 06/13/2019   Lab Results  Component Value Date   TSH 1.05 06/13/2019      ASSESSMENT AND PLAN 54 y.o. year old female  has a past medical history of Depression and HA (headache). here with:  1. Migraine headaches  - Continue to use imitrex when you get a migraine - If migraine frequency increases she was advised to let us know -Did discuss concerns related to Alzheimer's disease - FU in 1 year or sooner if needed.   I spent 25 minutes of face-to-face and non-face-to-face time with patient.  This included previsit chart review, lab review, study review, order entry,  electronic health record documentation, patient education.  Butch Penny, MSN, NP-C 11/18/2019, 9:33 PM [facility] [address], [address] [place], [place] [address] [phone]

## 2019-11-19 ENCOUNTER — Ambulatory Visit (INDEPENDENT_AMBULATORY_CARE_PROVIDER_SITE_OTHER): Payer: 59 | Admitting: Adult Health

## 2019-11-19 ENCOUNTER — Encounter: Payer: Self-pay | Admitting: Adult Health

## 2019-11-19 VITALS — BP 116/72 | HR 63 | Ht 65.0 in | Wt 134.0 lb

## 2019-11-19 DIAGNOSIS — G43009 Migraine without aura, not intractable, without status migrainosus: Secondary | ICD-10-CM

## 2019-11-19 NOTE — Patient Instructions (Signed)
Your Plan:  Continue Imitrex If your symptoms worsen or you develop new symptoms please let us know.   Thank you for coming to see us at [facility]. I hope we have been able to provide you high quality care today.  You may receive a patient satisfaction survey over the next few weeks. We would appreciate your feedback and comments so that we may continue to improve ourselves and the health of our patients.  

## 2019-12-17 ENCOUNTER — Other Ambulatory Visit: Payer: Self-pay | Admitting: Adult Health

## 2019-12-17 DIAGNOSIS — G43821 Menstrual migraine, not intractable, with status migrainosus: Secondary | ICD-10-CM

## 2020-03-04 ENCOUNTER — Other Ambulatory Visit: Payer: Self-pay

## 2020-03-04 ENCOUNTER — Emergency Department (HOSPITAL_BASED_OUTPATIENT_CLINIC_OR_DEPARTMENT_OTHER): Payer: 59

## 2020-03-04 ENCOUNTER — Emergency Department (HOSPITAL_BASED_OUTPATIENT_CLINIC_OR_DEPARTMENT_OTHER)
Admission: EM | Admit: 2020-03-04 | Discharge: 2020-03-04 | Disposition: A | Discharge status: Home / Self Care | Payer: 59 | Attending: Emergency Medicine | Admitting: Emergency Medicine

## 2020-03-04 ENCOUNTER — Encounter (HOSPITAL_BASED_OUTPATIENT_CLINIC_OR_DEPARTMENT_OTHER): Payer: Self-pay

## 2020-03-04 DIAGNOSIS — Z87891 Personal history of nicotine dependence: Secondary | ICD-10-CM | POA: Diagnosis not present

## 2020-03-04 DIAGNOSIS — R1011 Right upper quadrant pain: Secondary | ICD-10-CM | POA: Diagnosis present

## 2020-03-04 DIAGNOSIS — N200 Calculus of kidney: Secondary | ICD-10-CM | POA: Insufficient documentation

## 2020-03-04 LAB — COMPREHENSIVE METABOLIC PANEL
ALT: 21 U/L (ref 0–44)
AST: 20 U/L (ref 15–41)
Albumin: 3.7 g/dL (ref 3.5–5.0)
Alkaline Phosphatase: 37 U/L — ABNORMAL LOW (ref 38–126)
Anion gap: 9 (ref 5–15)
BUN: 12 mg/dL (ref 6–20)
CO2: 24 mmol/L (ref 22–32)
Calcium: 8.7 mg/dL — ABNORMAL LOW (ref 8.9–10.3)
Chloride: 102 mmol/L (ref 98–111)
Creatinine, Ser: 0.73 mg/dL (ref 0.44–1.00)
GFR, Estimated: >60 mL/min (ref >60)
Glucose, Bld: 106 mg/dL — ABNORMAL HIGH (ref 70–99)
Potassium: 3.9 mmol/L (ref 3.5–5.1)
Sodium: 135 mmol/L (ref 135–145)
Total Bilirubin: 0.7 mg/dL (ref 0.3–1.2)
Total Protein: 6 g/dL — ABNORMAL LOW (ref 6.5–8.1)

## 2020-03-04 LAB — CBC WITH DIFFERENTIAL/PLATELET
Abs Immature Granulocytes: 0.04 10*3/uL (ref 0.00–0.07)
Basophils Absolute: 0 10*3/uL (ref 0.0–0.1)
Basophils Relative: 0 %
Eosinophils Absolute: 0.1 10*3/uL (ref 0.0–0.5)
Eosinophils Relative: 1 %
HCT: 41.7 % (ref 36.0–46.0)
Hemoglobin: 14 g/dL (ref 12.0–15.0)
Immature Granulocytes: 0 %
Lymphocytes Relative: 12 %
Lymphs Abs: 1.2 10*3/uL (ref 0.7–4.0)
MCH: 30.3 pg (ref 26.0–34.0)
MCHC: 33.6 g/dL (ref 30.0–36.0)
MCV: 90.3 fL (ref 80.0–100.0)
Monocytes Absolute: 0.4 10*3/uL (ref 0.1–1.0)
Monocytes Relative: 4 %
Neutro Abs: 7.8 10*3/uL — ABNORMAL HIGH (ref 1.7–7.7)
Neutrophils Relative %: 83 %
Platelets: 178 10*3/uL (ref 150–400)
RBC: 4.62 MIL/uL (ref 3.87–5.11)
RDW: 13.9 % (ref 11.5–15.5)
WBC: 9.5 10*3/uL (ref 4.0–10.5)
nRBC: 0 % (ref 0.0–0.2)

## 2020-03-04 LAB — URINALYSIS, MICROSCOPIC (REFLEX): RBC / HPF: >50 RBC/hpf (ref 0–5)

## 2020-03-04 LAB — URINALYSIS, ROUTINE W REFLEX MICROSCOPIC
Bilirubin Urine: NEGATIVE
Glucose, UA: NEGATIVE mg/dL
Ketones, ur: NEGATIVE mg/dL
Leukocytes,Ua: NEGATIVE
Nitrite: NEGATIVE
Protein, ur: NEGATIVE mg/dL
Specific Gravity, Urine: 1.01 (ref 1.005–1.030)
pH: 8.5 — ABNORMAL HIGH (ref 5.0–8.0)

## 2020-03-04 LAB — PREGNANCY, URINE: Preg Test, Ur: NEGATIVE

## 2020-03-04 LAB — LIPASE, BLOOD: Lipase: 40 U/L (ref 11–51)

## 2020-03-04 MED ORDER — KETOROLAC TROMETHAMINE 30 MG/ML IJ SOLN
30.0000 mg | Freq: Once | INTRAMUSCULAR | Status: AC
  Administered 2020-03-04: 30 mg via INTRAMUSCULAR
  Filled 2020-03-04: qty 1

## 2020-03-04 MED ORDER — OXYCODONE-ACETAMINOPHEN 5-325 MG PO TABS: 1.0000 | ORAL_TABLET | Freq: Four times a day (QID) | ORAL | 0 refills | Status: AC | PRN

## 2020-03-04 MED ORDER — ONDANSETRON 4 MG PO TBDP
4.0000 mg | ORAL_TABLET | Freq: Once | ORAL | Status: AC
  Administered 2020-03-04: 4 mg via ORAL
  Filled 2020-03-04: qty 1

## 2020-03-04 MED ORDER — CEPHALEXIN 250 MG PO CAPS: 500.0000 mg | ORAL_CAPSULE | Freq: Once | ORAL | Status: DC

## 2020-03-04 MED ORDER — ONDANSETRON HCL 4 MG PO TABS: 4.0000 mg | ORAL_TABLET | Freq: Three times a day (TID) | ORAL | 0 refills | Status: DC | PRN

## 2020-03-04 MED ORDER — KETOROLAC TROMETHAMINE 15 MG/ML IJ SOLN: 15.0000 mg | Freq: Once | INTRAMUSCULAR | Status: DC

## 2020-03-04 NOTE — ED Provider Notes (Signed)
[facility] EMERGENCY DEPARTMENT Provider Note   CSN: [id number] Arrival date & time: 03/04/20  1110     History Chief Complaint  Patient presents with  . Flank Pain    Kathleen Hays is a 55 y.o. female with a past medical history of migraines, osteopenia.  Patient presents with chief complaint of right flank pain, patient reports that her pain began this morning at 0300, pain woke her from sleep, rates pain as a 10/10 on the pain scale, pain is constant , scribes pain as "achy," pain radiates into her right upper quadrant, no alleviating or aggravating factors.  Patient also reports that she has been unable to tolerate p.o. intake.  Has vomited once since this morning, no blood or coffee-ground emesis noted.    Patient reports that she has been having the symptoms on and off for the past month.  Patient reports that she recently had a UA done by her primary care provider and states that some hematuria was noted.  Denies any gross hematuria.  Patient reports has not had LMP for many years due to menopause.  G2P2002, patient reports 1 cesarean section.  No other abdominal surgeries.    Patient denies any fevers, chills, chest pain, shortness of breath, diarrhea, constipation, abdominal distention, bloody stool, vaginal bleeding, hematuria, vaginal discharge, pelvic pain, urinary frequency, dysuria.    HPI     Past Medical History:  Diagnosis Date  . Depression    PTSD secondary to witnessed father suicide (gunshot to chest)  . HA (headache)     Patient Active Problem List   Diagnosis Date Noted  . Vitamin D deficiency 03/03/2018  . Low vitamin B12 level 09/08/2017  . Osteopenia 09/08/2017  . Family history of pernicious anemia 03/02/2017  . Hx of anorexia nervosa 01/23/2016  . History of colonic polyps 01/23/2016  . Migraines     Past Surgical History:  Procedure Laterality Date  . CESAREAN SECTION    . MOUTH SURGERY    . TONSILLECTOMY AND ADENOIDECTOMY     . WISDOM TOOTH EXTRACTION Bilateral 1984   Patient was 55 years old.      OB History   No obstetric history on file.     Family History  Problem Relation Age of Onset  . High blood pressure Mother   . Migraines Mother   . Thyroid disease Mother   . Depression Father   . Suicidality Father        gunshot  . Diabetes Maternal Grandfather   . Diabetes Paternal Grandmother     Social History   Tobacco Use  . Smoking status: Former Smoker    Types: Cigarettes    Quit date: 2010    Years since quitting: 12.1  . Smokeless tobacco: Never Used  . Tobacco comment: Quit 2015  Substance Use Topics  . Alcohol use: No    Alcohol/week: 0.0 standard drinks  . Drug use: No    Home Medications Prior to Admission medications   Medication Sig Start Date End Date Taking? Authorizing Provider  Misc Natural Products (TART CHERRY ADVANCED PO) Take 2 capsules by mouth daily.    [provider]  Multiple Vitamin (MULTIVITAMIN) tablet Take 1 tablet by mouth daily.    [provider]  naproxen sodium (ALEVE) 220 MG tablet Take 220 mg by mouth 2 (two) times daily as needed.    [provider]  SUMAtriptan (IMITREX) 100 MG tablet TAKE 1 TABLET AT THE ONSET OF MIGRAINE. MAY   REPEAT IN 2 HOURS IF HEADACHE PERSISTS OR RECURS. NOT TO EXCEED 2TABS/24 HOURS. 12/17/19   Ward Givens, NP    Allergies    Macrobid [nitrofurantoin macrocrystal], Avelox [moxifloxacin hcl in nacl], and Augmentin [amoxicillin-pot clavulanate]  Review of Systems   Review of Systems  Constitutional: Negative for chills and fever.  Eyes: Negative for visual disturbance.  Respiratory: Negative for shortness of breath.   Cardiovascular: Negative for chest pain.  Gastrointestinal: Positive for abdominal pain, nausea and vomiting. Negative for abdominal distention, anal bleeding, blood in stool, constipation, diarrhea and rectal pain.  Genitourinary: Negative for difficulty urinating, dysuria,  frequency, hematuria, pelvic pain, vaginal bleeding, vaginal discharge and vaginal pain.  Musculoskeletal: Negative for back pain and neck pain.  Skin: Negative for color change and rash.  Neurological: Negative for dizziness, syncope, light-headedness and headaches.  Psychiatric/Behavioral: Negative for confusion.    Physical Exam Updated Vital Signs BP 128/75 (BP Location: Right Arm)   Pulse (!) 53   Temp 98 F (36.7 C) (Oral)   Resp 18   Ht 5' 5" (1.651 m)   Wt 59.9 kg   LMP 01/25/2013 (Approximate)   SpO2 100%   BMI 21.97 kg/m   Physical Exam Vitals and nursing note reviewed.  Constitutional:      General: She is not in acute distress.    Appearance: She is not ill-appearing, toxic-appearing or diaphoretic.  HENT:     Head: Normocephalic.  Eyes:     General: No scleral icterus.       Right eye: No discharge.        Left eye: No discharge.  Cardiovascular:     Rate and Rhythm: Normal rate.  Pulmonary:     Effort: Pulmonary effort is normal. No respiratory distress.     Breath sounds: Normal breath sounds. No stridor. No wheezing, rhonchi or rales.  Abdominal:     General: Abdomen is flat. Bowel sounds are normal. There is no distension. There are no signs of injury.     Palpations: Abdomen is soft. There is no mass or pulsatile mass.     Tenderness: There is abdominal tenderness in the right upper quadrant. There is right CVA tenderness. There is no left CVA tenderness, guarding or rebound. Negative signs include Murphy's sign, Rovsing's sign and McBurney's sign.  Musculoskeletal:     Cervical back: Neck supple.     Right lower leg: No swelling, deformity, tenderness or bony tenderness. No edema.     Left lower leg: No swelling, deformity, tenderness or bony tenderness. No edema.  Skin:    General: Skin is warm and dry.     Coloration: Skin is not jaundiced or pale.  Neurological:     General: No focal deficit present.     Mental Status: She is alert.   Psychiatric:        Behavior: Behavior is cooperative.     ED Results / Procedures / Treatments   Labs (all labs ordered are listed, but only abnormal results are displayed) Labs Reviewed  URINALYSIS, ROUTINE W REFLEX MICROSCOPIC - Abnormal; Notable for the following components:      Result Value   APPearance HAZY (*)    pH 8.5 (*)    Hgb urine dipstick LARGE (*)    All other components within normal limits  COMPREHENSIVE METABOLIC PANEL - Abnormal; Notable for the following components:   Glucose, Bld 106 (*)    Calcium 8.7 (*)    Total Protein 6.0 (*)  Alkaline Phosphatase 37 (*)    All other components within normal limits  CBC WITH DIFFERENTIAL/PLATELET - Abnormal; Notable for the following components:   Neutro Abs 7.8 (*)    All other components within normal limits  URINALYSIS, MICROSCOPIC (REFLEX) - Abnormal; Notable for the following components:   Bacteria, UA FEW (*)    All other components within normal limits  LIPASE, BLOOD  PREGNANCY, URINE    EKG None  Radiology CT Renal Stone Study  Result Date: 03/04/2020 CLINICAL DATA:  Right flank pain since this morning. EXAM: CT ABDOMEN AND PELVIS WITHOUT CONTRAST TECHNIQUE: Multidetector CT imaging of the abdomen and pelvis was performed following the standard protocol without IV contrast. COMPARISON:  None. FINDINGS: Lower chest: Streaky basilar atelectasis or scarring changes. No infiltrates or effusions. The heart is normal in size. Small amount of pericardial fluid without overt effusion. Hepatobiliary: Several low-attenuation liver lesions most likely benign cysts. No intrahepatic biliary dilatation. The gallbladder appears normal. No common bile duct dilatation. Pancreas: No mass, inflammation or ductal dilatation. Spleen: Normal size.  No focal lesions. Adrenals/Urinary Tract: Adrenal glands are unremarkable. Moderate to marked right-sided hydronephrosis due to a 14 x 7 mm right UPJ calculus. Both ureters are slightly  dilated but no obstructing ureteral calculi. This is likely due to mild to moderate bladder distension. No bladder lesions or bladder calculi. No worrisome renal lesions without contrast. Stomach/Bowel: The stomach, duodenum, small bowel and colon are grossly normal without oral contrast. No inflammatory changes, mass lesions or obstructive findings. The appendix is normal. Vascular/Lymphatic: Moderate atherosclerotic calcifications involving the aorta and iliac arteries. No aneurysm. No mesenteric or retroperitoneal mass or adenopathy. Reproductive: The uterus and ovaries are unremarkable. Other: No pelvic mass or adenopathy. No free pelvic fluid collections. No inguinal mass or adenopathy. No abdominal wall hernia or subcutaneous lesions. Musculoskeletal: No significant bony findings. IMPRESSION: 1. 14 x 7 mm right UPJ calculus causing moderate to marked right-sided hydronephrosis. 2. Both ureters are slightly dilated but no obstructing ureteral calculi. This is likely due to mild to moderate bladder distension. 3. No other significant abdominal/pelvic findings, mass lesions or adenopathy. 4. Several low-attenuation liver lesions most likely benign cysts. 5. Aortic atherosclerosis. Aortic Atherosclerosis (ICD10-I70.0). Electronically Signed   By: P.  Gallerani M.D.   On: 03/04/2020 14:31    Procedures Procedures   Medications Ordered in ED Medications  ondansetron (ZOFRAN-ODT) disintegrating tablet 4 mg (4 mg Oral Given 03/04/20 1321)  ketorolac (TORADOL) 30 MG/ML injection 30 mg (30 mg Intramuscular Given 03/04/20 1349)    ED Course  I have reviewed the triage vital signs and the nursing notes.  Pertinent labs & imaging results that were available during my care of the patient were reviewed by me and considered in my medical decision making (see chart for details).    MDM Rules/Calculators/A&P                          Alert 54-year-old female in no acute distress, nontoxic-appearing. Patient  presents with a chief complaint of right flank pain that radiates into her upper right quadrant. Patient reports that pain began suddenly this morning at 3 AM and woke her from sleep. Patient reports that she has been having right flank pain intermittently for the past month. Patient reports that she recently was noted to have hematuria on urinalysis performed by her primary care provider. Patient also endorses nausea and vomiting.  On physical exam patient has normoactive bowel   sounds, soft nondistended abdomen, tenderness to right upper quadrant and right flank. Positive right CVA tenderness.  Lipase within normal limits; less concerning for pancreatitis. Urine pregnancy test negative; less concerning for ectopic pregnancy. AST, ALT, total bili all within normal limits; alk phos slightly lowered at 37; less concerning for hepatobiliary disease.  Patient noted to have hematuria on her urinalysis. Few bacteria noted, no nitrites or leukocytes. Patient denies any dysuria or increased urinary frequency. Less concern for urinary tract infection or pyelonephritis at this time.  Due to patient's pain pattern and hematuria will assess for renal calculi.   Patient was given Zofran to help with her nausea. Patient was offered opiates for pain management but refused. Patient was given Toradol to help with pain management.  Renal CT study showed 1) 14 X 7 mm right UPJ calculus causing moderate to marked right-sided hydronephrosis 2) Ureters are slightly dilated but no obstructing ureteral calculi. This is likely due to mild to moderate bladder distention. 3) several low-attenuation liver lesions most likely benign cysts. 4) aortic arthrosclerosis  Due to the size of patient's renal calculus urology was consulted. Spoke with Dr. Herrick who recommended patient for lithotripsy. Patient will be seen in his clinic tomorrow. Patient will be contacted by clinic to set up appointment.  Patient's pain is under  control with 1 dose of Toradol. Nausea resolved with Zofran. Patient reports that she has urinated since her CT scan was performed; less concerning for urinary retention.  Patient was advised of plan to see urology in outpatient clinic tomorrow for lithotripsy. Patient is agreeable to this plan. Patient was informed that she cannot take any NSAIDs for her pain management. Patient was prescribed Percocet for severe pain and advised to use Tylenol for mild to moderate pain. Patient was prescribed Zofran for her nausea.  Discussed results, findings, treatment and follow up. Patient advised of return precautions. Patient verbalized understanding and agreed with plan.    Final Clinical Impression(s) / ED Diagnoses Final diagnoses:  Kidney stone    Rx / DC Orders ED Discharge Orders         Ordered    oxyCODONE-acetaminophen (PERCOCET/ROXICET) 5-325 MG tablet  Every 6 hours PRN        03/04/20 1654    ondansetron (ZOFRAN) 4 MG tablet  Every 8 hours PRN        03/04/20 1654           Badalamente, Peter R, PA-C 03/04/20 2236    Floyd, Dan, DO 03/06/20 0815  

## 2020-03-04 NOTE — ED Triage Notes (Signed)
Pt c/o right flank pain radiating to mid abdomen starting at 0300. Also having N/V. Denies urinary symptoms.

## 2020-03-04 NOTE — Discharge Instructions (Addendum)
You came to the emerge department to be evaluated for your right flank pain.  Your CT scan showed a 14 x 7 mm right stone.  Due to this you will need to follow-up with urology and Dr. Jasmine Awe office.  You should receive a phone call from this office if not please use the information provided here to call them tomorrow morning.  These do not take any ibuprofen or other NSAID medication tonight.    I have prescribed you Percocet to take as needed for severe abdominal pain.  Other than that you may use Tylenol to help manage your pain.  Please do not take more than 3000 mg of Tylenol in 1 day.  Please not combine this medication with alcohol.  Also given you prescription for Zofran.  You may use this as needed every 8 hours for nausea or vomiting.  Please return to the emergency department if: You have a fever or chills. You develop severe pain. You develop new abdominal pain. You faint. You are unable to urinate.

## 2020-03-06 ENCOUNTER — Other Ambulatory Visit: Payer: Self-pay | Admitting: Urology

## 2020-03-06 ENCOUNTER — Other Ambulatory Visit (HOSPITAL_COMMUNITY)
Admission: RE | Admit: 2020-03-06 | Discharge: 2020-03-06 | Disposition: A | Discharge status: Home / Self Care | Payer: 59 | Source: Ambulatory Visit | Attending: Urology | Admitting: Urology

## 2020-03-06 DIAGNOSIS — Z20822 Contact with and (suspected) exposure to covid-19: Secondary | ICD-10-CM | POA: Diagnosis not present

## 2020-03-06 DIAGNOSIS — N200 Calculus of kidney: Secondary | ICD-10-CM

## 2020-03-06 DIAGNOSIS — Z01812 Encounter for preprocedural laboratory examination: Secondary | ICD-10-CM | POA: Diagnosis not present

## 2020-03-06 LAB — SARS CORONAVIRUS 2 (TAT 6-24 HRS): SARS Coronavirus 2: NEGATIVE

## 2020-03-06 NOTE — Progress Notes (Signed)
Patient to arrive at 0645 on 03/10/2020. History and medications reviewed. Pre-procedure instructions given. NPO after MN on Sunday except for clear liquids until 0445. Driver secured.

## 2020-03-07 ENCOUNTER — Other Ambulatory Visit: Payer: Self-pay | Admitting: Urology

## 2020-03-10 ENCOUNTER — Other Ambulatory Visit: Payer: Self-pay

## 2020-03-10 ENCOUNTER — Encounter (HOSPITAL_BASED_OUTPATIENT_CLINIC_OR_DEPARTMENT_OTHER): Payer: Self-pay

## 2020-03-10 ENCOUNTER — Ambulatory Visit (HOSPITAL_BASED_OUTPATIENT_CLINIC_OR_DEPARTMENT_OTHER)
Admission: RE | Admit: 2020-03-10 | Discharge: 2020-03-10 | Disposition: A | Discharge status: Home / Self Care | Payer: 59 | Attending: Urology | Admitting: Urology

## 2020-03-10 ENCOUNTER — Encounter (HOSPITAL_BASED_OUTPATIENT_CLINIC_OR_DEPARTMENT_OTHER): Payer: Self-pay | Admitting: Urology

## 2020-03-10 ENCOUNTER — Emergency Department (HOSPITAL_BASED_OUTPATIENT_CLINIC_OR_DEPARTMENT_OTHER)
Admission: EM | Admit: 2020-03-10 | Discharge: 2020-03-11 | Disposition: A | Discharge status: Home / Self Care | Payer: 59 | Source: Home / Self Care | Attending: Emergency Medicine | Admitting: Emergency Medicine

## 2020-03-10 ENCOUNTER — Ambulatory Visit (HOSPITAL_COMMUNITY): Payer: 59

## 2020-03-10 ENCOUNTER — Encounter (HOSPITAL_BASED_OUTPATIENT_CLINIC_OR_DEPARTMENT_OTHER)
Admission: RE | Disposition: A | Discharge status: Home / Self Care | Payer: Self-pay | Source: Home / Self Care | Attending: Urology

## 2020-03-10 DIAGNOSIS — N200 Calculus of kidney: Secondary | ICD-10-CM | POA: Diagnosis present

## 2020-03-10 DIAGNOSIS — Z881 Allergy status to other antibiotic agents status: Secondary | ICD-10-CM | POA: Diagnosis not present

## 2020-03-10 DIAGNOSIS — Z888 Allergy status to other drugs, medicaments and biological substances status: Secondary | ICD-10-CM | POA: Diagnosis not present

## 2020-03-10 DIAGNOSIS — Z79899 Other long term (current) drug therapy: Secondary | ICD-10-CM | POA: Insufficient documentation

## 2020-03-10 DIAGNOSIS — N23 Unspecified renal colic: Secondary | ICD-10-CM

## 2020-03-10 DIAGNOSIS — G8918 Other acute postprocedural pain: Secondary | ICD-10-CM

## 2020-03-10 DIAGNOSIS — Z87891 Personal history of nicotine dependence: Secondary | ICD-10-CM | POA: Insufficient documentation

## 2020-03-10 HISTORY — DX: Family history of other specified conditions: Z84.89

## 2020-03-10 HISTORY — DX: Calculus of kidney: N20.0

## 2020-03-10 HISTORY — PX: EXTRACORPOREAL SHOCK WAVE LITHOTRIPSY: SHX1557

## 2020-03-10 SURGERY — LITHOTRIPSY, ESWL
Anesthesia: LOCAL | Laterality: Right

## 2020-03-10 MED ORDER — TAMSULOSIN HCL 0.4 MG PO CAPS: 0.4000 mg | ORAL_CAPSULE | Freq: Every day | ORAL | 1 refills | Status: DC

## 2020-03-10 MED ORDER — SODIUM CHLORIDE 0.9 % IV SOLN: INTRAVENOUS | Status: DC

## 2020-03-10 MED ORDER — HYDROMORPHONE HCL 1 MG/ML IJ SOLN: 2.0000 mg | Freq: Once | INTRAMUSCULAR | Status: DC

## 2020-03-10 MED ORDER — ONDANSETRON HCL 4 MG/2ML IJ SOLN
4.0000 mg | Freq: Once | INTRAMUSCULAR | Status: AC
  Administered 2020-03-10: 4 mg via INTRAVENOUS
  Filled 2020-03-10: qty 2

## 2020-03-10 MED ORDER — HYDROMORPHONE HCL 1 MG/ML IJ SOLN
1.0000 mg | Freq: Once | INTRAMUSCULAR | Status: AC
  Administered 2020-03-10: 1 mg via INTRAVENOUS
  Filled 2020-03-10: qty 1

## 2020-03-10 MED ORDER — DIAZEPAM 5 MG PO TABS
ORAL_TABLET | ORAL | Status: AC
  Filled 2020-03-10: qty 2

## 2020-03-10 MED ORDER — ONDANSETRON HCL 4 MG/2ML IJ SOLN: 4.0000 mg | Freq: Once | INTRAMUSCULAR | Status: DC

## 2020-03-10 MED ORDER — DIPHENHYDRAMINE HCL 25 MG PO CAPS
25.0000 mg | ORAL_CAPSULE | ORAL | Status: AC
  Administered 2020-03-10: 25 mg via ORAL

## 2020-03-10 MED ORDER — DIAZEPAM 5 MG PO TABS
10.0000 mg | ORAL_TABLET | ORAL | Status: AC
  Administered 2020-03-10: 10 mg via ORAL

## 2020-03-10 MED ORDER — HYDROCODONE-ACETAMINOPHEN 5-325 MG PO TABS: 1.0000 | ORAL_TABLET | ORAL | 0 refills | Status: DC | PRN

## 2020-03-10 MED ORDER — DIPHENHYDRAMINE HCL 25 MG PO CAPS
ORAL_CAPSULE | ORAL | Status: AC
  Filled 2020-03-10: qty 1

## 2020-03-10 MED ORDER — SODIUM CHLORIDE 0.9 % IV BOLUS
1000.0000 mL | Freq: Once | INTRAVENOUS | Status: AC
  Administered 2020-03-10: 1000 mL via INTRAVENOUS

## 2020-03-10 NOTE — ED Triage Notes (Signed)
Pt states she had lithotripsy today-states right flank pain is worse and n/v-no relief with percocet and zofran

## 2020-03-10 NOTE — H&P (Signed)
See scanned H&P

## 2020-03-10 NOTE — Op Note (Signed)
See [facility] OP note scanned into chart. Also because of the size, density, location and other factors that cannot be anticipated I feel this will likely be a staged procedure. This fact supersedes any indication in the scanned [facility] operative note to the contrary.  

## 2020-03-10 NOTE — Discharge Instructions (Signed)

## 2020-03-10 NOTE — ED Provider Notes (Signed)
[facility] Provider Note: Lowella Dell, MD, FACEP  CSN: [id number] MRN: [id number] ARRIVAL: 03/10/20 at 2000 ROOM: [facility]06/[facility]06   CHIEF COMPLAINT  Flank Pain   HISTORY OF PRESENT ILLNESS  03/10/20 10:54 PM Kathleen Hays is a 55 y.o. female who underwent lithotripsy this morning for a 14 x 7 mm right UPJ calculus.  I am unable to access Dr. Shannan Harper note but his comments indicate that "because of the size, density, location and other factors that cannot be anticipated I feel this will likely be a staged procedure.  This fact supersedes any indication in the scanned [place] stone operative note to the contrary."  The patient returns to the ED with right flank pain that she states is worse than it was before and has been associated with nausea and vomiting.  She rated the pain as 10 out of 10 on arrival and states that she got no relief of her symptoms with Percocet (per patient, hydrocodone/APAP per Epic) and Zofran.  She states every time she tries to drink something she vomits (total about 5 episodes) and movement makes her pain worse.  Lying still she rates her pain is a 7 out of 10.  She had blood in her urine earlier but is not sure if she is still passing blood.  Her urine strainer shows grit.   Past Medical History:  Diagnosis Date  . Depression    PTSD secondary to witnessed father suicide (gunshot to chest)  . Family history of adverse reaction to anesthesia    brother is combative after anesthesia, mother has confusion.  . HA (headache)   . Kidney stone     Past Surgical History:  Procedure Laterality Date  . CESAREAN SECTION    . LITHOTRIPSY    . MOUTH SURGERY    . TONSILLECTOMY AND ADENOIDECTOMY    . WISDOM TOOTH EXTRACTION Bilateral 1984   Patient was 55 years old.     Family History  Problem Relation Age of Onset  . High blood pressure Mother   . Migraines Mother   . Thyroid disease Mother   . Depression Father   . Suicidality Father         gunshot  . Diabetes Maternal Grandfather   . Diabetes Paternal Grandmother     Social History   Tobacco Use  . Smoking status: Former Smoker    Types: Cigarettes    Quit date: 2010    Years since quitting: 12.1  . Smokeless tobacco: Never Used  . Tobacco comment: Quit 2015  Substance Use Topics  . Alcohol use: No    Alcohol/week: 0.0 standard drinks  . Drug use: No    Prior to Admission medications   Medication Sig Start Date End Date Taking? Authorizing Provider  HYDROcodone-acetaminophen (NORCO/VICODIN) 5-325 MG tablet Take 1 tablet by mouth every 4 (four) hours as needed for moderate pain. 03/10/20 03/10/21  Ray Church III, MD  naproxen sodium (ALEVE) 220 MG tablet Take 220 mg by mouth 2 (two) times daily as needed.    [provider]  ondansetron (ZOFRAN) 4 MG tablet Take 1 tablet (4 mg total) by mouth every 8 (eight) hours as needed for nausea or vomiting. 03/04/20   Haskel Schroeder, PA-C  sulfamethoxazole-trimethoprim (BACTRIM) 200-40 MG/5ML suspension Take by mouth 2 (two) times daily.    [provider]  SUMAtriptan (IMITREX) 100 MG tablet TAKE 1 TABLET AT THE ONSET OF MIGRAINE. MAY REPEAT IN 2 HOURS IF HEADACHE PERSISTS  OR RECURS. NOT TO EXCEED 2TABS/24 HOURS. 12/17/19   Butch Penny, NP  tamsulosin (FLOMAX) 0.4 MG CAPS capsule Take 1 capsule (0.4 mg total) by mouth daily. 03/10/20   Crista Elliot, MD    Allergies Macrobid [nitrofurantoin macrocrystal], Avelox [moxifloxacin hcl in nacl], Augmentin [amoxicillin-pot clavulanate], and Bactrim [sulfamethoxazole-trimethoprim]   REVIEW OF SYSTEMS  Negative except as noted here or in the History of Present Illness.   PHYSICAL EXAMINATION  Initial Vital Signs Blood pressure (!) 95/56, pulse (!) 54, temperature 97.9 F (36.6 C), temperature source Oral, resp. rate 20, last menstrual period 01/25/2013, SpO2 93 %.  Examination General: Well-developed, well-nourished female in no acute distress;  appearance consistent with age of record HENT: normocephalic; atraumatic Eyes: pupils equal, round and reactive to light; extraocular muscles intact Neck: supple Heart: regular rate and rhythm Lungs: clear to auscultation bilaterally Abdomen: soft; nondistended; nontender; bowel sounds present  GU: Mild right CVA tenderness Extremities: No deformity; full range of motion; pulses normal Neurologic: Awake, alert and oriented; motor function intact in all extremities and symmetric; no facial droop Skin: Warm and dry Psychiatric: Normal mood and affect   RESULTS  Summary of this visit's results, reviewed and interpreted by myself:   EKG Interpretation  Date/Time:    Ventricular Rate:    PR Interval:    QRS Duration:   QT Interval:    QTC Calculation:   R Axis:     Text Interpretation:        Laboratory Studies: No results found for this or any previous visit (from the past 24 hour(s)). Imaging Studies: DG Abd 1 View  Result Date: 03/10/2020 CLINICAL DATA:  Reassess nephrolithiasis. EXAM: ABDOMEN - 1 VIEW COMPARISON:  CT abdomen and pelvis March 14, 2020. FINDINGS: The bowel gas pattern is normal. 1.0 cm right renal calculus. Spondylosis. IMPRESSION: 1.0 cm right renal calculus. Electronically Signed   By: Maudry Mayhew MD   On: 03/10/2020 08:12    ED COURSE and MDM  Nursing notes, initial and subsequent vitals signs, including pulse oximetry, reviewed and interpreted by myself.  Vitals:   03/10/20 2014 03/10/20 2152 03/10/20 2200 03/11/20 0000  BP: 123/80 106/65 (!) 95/56 92/62  Pulse: (!) 56 (!) 54 (!) 54 62  Resp: 20 18 20 18   Temp: 97.9 F (36.6 C)     TempSrc: Oral     SpO2: 100% 96% 93% 90%   Medications  sodium chloride 0.9 % bolus 1,000 mL (1,000 mLs Intravenous New Bag/Given 03/10/20 2336)  ondansetron (ZOFRAN) injection 4 mg (4 mg Intravenous Given 03/10/20 2316)  HYDROmorphone (DILAUDID) injection 1 mg (1 mg Intravenous Given 03/10/20 2320)   12:33  AM Pain and nausea well controlled at this time.  Since patient not tolerating oral intake with prescribed Zofran we will provide Phenergan suppositories.  We will also trial a short course of Dilaudid as hydrocodone has not been effective.   PROCEDURES  Procedures   ED DIAGNOSES     ICD-10-CM   1. Ureteral colic  N23   2. Postoperative pain  G89.18        Kaion Tisdale, 03/12/20, MD 03/11/20 [phone]

## 2020-03-11 ENCOUNTER — Encounter (HOSPITAL_BASED_OUTPATIENT_CLINIC_OR_DEPARTMENT_OTHER): Payer: Self-pay | Admitting: Urology

## 2020-03-11 MED ORDER — HYDROMORPHONE HCL 2 MG PO TABS
2.0000 mg | ORAL_TABLET | ORAL | 0 refills | Status: DC | PRN
Start: 2020-03-11 — End: 2020-10-20

## 2020-03-11 MED ORDER — PROMETHAZINE HCL 25 MG RE SUPP: 25.0000 mg | Freq: Four times a day (QID) | RECTAL | 0 refills | Status: DC | PRN

## 2020-05-12 ENCOUNTER — Other Ambulatory Visit: Payer: Self-pay | Admitting: Adult Health

## 2020-05-12 DIAGNOSIS — G43821 Menstrual migraine, not intractable, with status migrainosus: Secondary | ICD-10-CM

## 2020-10-15 ENCOUNTER — Encounter: Payer: 59 | Admitting: Internal Medicine

## 2020-10-19 DIAGNOSIS — Z833 Family history of diabetes mellitus: Secondary | ICD-10-CM | POA: Insufficient documentation

## 2020-10-19 NOTE — Progress Notes (Signed)
Subjective:    Patient ID: Kathleen Hays, female    DOB: [birth year], 55 y.o.   MRN: [id number]   This visit occurred during the SARS-CoV-2 public health emergency.  Safety protocols were in place, including screening questions prior to the visit, additional usage of staff PPE, and extensive cleaning of exam room while observing appropriate contact time as indicated for disinfecting solutions.    HPI She is here for a physical exam.   She has no concerns.  Medications and allergies reviewed with patient and updated if appropriate.  Patient Active Problem List   Diagnosis Date Noted   History of nephrolithiasis 10/20/2020   Family history of diabetes mellitus in mother 10/19/2020   Vitamin D deficiency 03/03/2018   Low vitamin B12 level 09/08/2017   Osteopenia 09/08/2017   Family history of pernicious anemia 03/02/2017   Hx of anorexia nervosa 01/23/2016   History of colonic polyps 01/23/2016   Migraines     Current Outpatient Medications on File Prior to Visit  Medication Sig Dispense Refill   naproxen sodium (ALEVE) 220 MG tablet Take 220 mg by mouth 2 (two) times daily as needed.     SUMAtriptan (IMITREX) 100 MG tablet TAKE 1 TABLET AT THE ONSET OF MIGRAINE. MAY REPEAT IN 2 HOURS IF HEADACHE PERSISTS OR RECURS. NOT TO EXCEED 2TABS/24 HOURS. 10 tablet 3   No current facility-administered medications on file prior to visit.    Past Medical History:  Diagnosis Date   Depression    PTSD secondary to witnessed father suicide (gunshot to chest)   Family history of adverse reaction to anesthesia    brother is combative after anesthesia, mother has confusion.   HA (headache)    Kidney stone     Past Surgical History:  Procedure Laterality Date   CESAREAN SECTION     EXTRACORPOREAL SHOCK WAVE LITHOTRIPSY Right 03/10/2020   Procedure: EXTRACORPOREAL SHOCK WAVE LITHOTRIPSY (ESWL);  Surgeon: Crista Elliot, MD;  Location: [facility];  Service:  Urology;  Laterality: Right;   LITHOTRIPSY     MOUTH SURGERY     TONSILLECTOMY AND ADENOIDECTOMY     WISDOM TOOTH EXTRACTION Bilateral 1984   Patient was 55 years old.     Social History   Socioeconomic History   Marital status: Married    Spouse name: [name]   Number of children: 2   Years of education: RN   Highest education level: Not on file  Occupational History   Occupation: Engineering geologist: [employer]  Tobacco Use   Smoking status: Former    Types: Cigarettes    Quit date: 2010    Years since quitting: 12.7   Smokeless tobacco: Never   Tobacco comments:    Quit 2015  Substance and Sexual Activity   Alcohol use: No    Alcohol/week: 0.0 standard drinks   Drug use: No   Sexual activity: Yes    Partners: Male    Birth control/protection: Other-see comments, Post-menopausal    Comment: Without menstral cycle for 3 year on January 2018  Other Topics Concern   Not on file  Social History Narrative   Patient lives at home with her husband [name]  and daughter.   Patient works at [employer] and [employer]    [employer] fast [employer].   Right handed.   Caffeine none.   Social Determinants of Health   Financial Resource Strain: Not on file  Food Insecurity: Not on  file  Transportation Needs: Not on file  Physical Activity: Not on file  Stress: Not on file  Social Connections: Not on file    Family History  Problem Relation Age of Onset   High blood pressure Mother    Migraines Mother    Thyroid disease Mother    Depression Father    Suicidality Father        gunshot   Diabetes Maternal Grandfather    Diabetes Paternal Grandmother     Review of Systems  Constitutional:  Negative for chills and fever.  Eyes:  Negative for visual disturbance.  Respiratory:  Negative for cough, shortness of breath and wheezing.   Cardiovascular:  Negative for chest pain, palpitations and leg swelling.  Gastrointestinal:  Negative for abdominal pain, blood in stool,  constipation, diarrhea and nausea.       No gerd  Genitourinary:  Negative for dysuria and hematuria.  Musculoskeletal:  Negative for arthralgias and back pain.  Skin:  Negative for color change and rash.  Neurological:  Positive for headaches (migraines). Negative for dizziness, light-headedness and numbness.  Psychiatric/Behavioral:  Negative for dysphoric mood and sleep disturbance. The patient is not nervous/anxious.       Objective:   Vitals:   10/20/20 0905  BP: 100/78  Pulse: (!) 53  Temp: 98.1 F (36.7 C)  SpO2: 97%   Filed Weights   10/20/20 0905  Weight: 131 lb (59.4 kg)   Body mass index is 21.8 kg/m.  BP Readings from Last 3 Encounters:  10/20/20 100/78  03/11/20 92/62  03/10/20 (!) 114/57    Wt Readings from Last 3 Encounters:  10/20/20 131 lb (59.4 kg)  03/10/20 135 lb (61.2 kg)  03/04/20 132 lb (59.9 kg)    Depression screen [facility] 2/9 10/20/2020 06/13/2019 03/03/2018  Decreased Interest 0 0 0  Down, Depressed, Hopeless 0 0 0  PHQ - 2 Score 0 0 0  Altered sleeping 0 - -  Tired, decreased energy 0 - -  Change in appetite 0 - -  Feeling bad or failure about yourself  0 - -  Trouble concentrating 0 - -  Moving slowly or fidgety/restless 0 - -  Suicidal thoughts 0 - -  PHQ-9 Score 0 - -    GAD 7 : Generalized Anxiety Score 10/20/2020  Nervous, Anxious, on Edge 0  Control/stop worrying 0  Worry too much - different things 0  Trouble relaxing 0  Restless 0  Easily annoyed or irritable 0  Afraid - awful might happen 0  Total GAD 7 Score 0        Physical Exam Constitutional: She appears well-developed and well-nourished. No distress.  HENT:  Head: Normocephalic and atraumatic.  Right Ear: External ear normal. Normal ear canal and TM Left Ear: External ear normal.  Normal ear canal and TM Mouth/Throat: Oropharynx is clear and moist.  Eyes: Conjunctivae and EOM are normal.  Neck: Neck supple. No tracheal deviation present. No thyromegaly  present.  No carotid bruit  Cardiovascular: Normal rate, regular rhythm and normal heart sounds.   No murmur heard.  No edema. Pulmonary/Chest: Effort normal and breath sounds normal. No respiratory distress. She has no wheezes. She has no rales.  Breast: deferred   Abdominal: Soft. She exhibits no distension. There is no tenderness.  Lymphadenopathy: She has no cervical adenopathy.  Skin: Skin is warm and dry. She is not diaphoretic.  Psychiatric: She has a normal mood and affect. Her behavior is normal.  Lab Results  Component Value Date   WBC 9.5 03/04/2020   HGB 14.0 03/04/2020   HCT 41.7 03/04/2020   PLT 178 03/04/2020   GLUCOSE 106 (H) 03/04/2020   CHOL 226 (H) 06/13/2019   TRIG 80.0 06/13/2019   HDL 89.40 06/13/2019   LDLCALC 121 (H) 06/13/2019   ALT 21 03/04/2020   AST 20 03/04/2020   NA 135 03/04/2020   K 3.9 03/04/2020   CL 102 03/04/2020   CREATININE 0.73 03/04/2020   BUN 12 03/04/2020   CO2 24 03/04/2020   TSH 1.05 06/13/2019         Assessment & Plan:   Physical exam: Screening blood work  ordered Exercise  very active Weight  normal Substance abuse  none   Screened for depression using the PHQ 9 scale.  No evidence of depression.   Screened for anxiety using GAD7 Scale.  No evidence of anxiety.   Reviewed recommended immunizations.   Health Maintenance  Topic Date Due   DEXA SCAN  03/08/2020   PAP SMEAR-Modifier  10/20/2020 (Originally 01/01/1985)   COVID-19 Vaccine (1) 11/05/2020 (Originally 07/02/1966)   Zoster Vaccines- Shingrix (1 of 2) 01/19/2021 (Originally 01/01/2014)   INFLUENZA VACCINE  04/24/2021 (Originally 08/25/2020)   MAMMOGRAM  10/20/2021 (Originally 01/01/2014)   COLONOSCOPY (Pts 45-65yrs Insurance coverage will need to be confirmed)  10/20/2021 (Originally 04/26/2012)   TETANUS/TDAP  10/20/2021 (Originally 01/25/2018)   Hepatitis C Screening  Completed   HIV Screening  Completed   HPV VACCINES  Aged Out          See  Problem List for Assessment and Plan of chronic medical problems.

## 2020-10-19 NOTE — Patient Instructions (Addendum)
Blood work was ordered.     Medications changes include :   none   A dexa scan was ordered.   Please followup in 1 year   Health Maintenance, Female Adopting a healthy lifestyle and getting preventive care are important in promoting health and wellness. Ask your health care provider about: The right schedule for you to have regular tests and exams. Things you can do on your own to prevent diseases and keep yourself healthy. What should I know about diet, weight, and exercise? Eat a healthy diet  Eat a diet that includes plenty of vegetables, fruits, low-fat dairy products, and lean protein. Do not eat a lot of foods that are high in solid fats, added sugars, or sodium. Maintain a healthy weight Body mass index (BMI) is used to identify weight problems. It estimates body fat based on height and weight. Your health care provider can help determine your BMI and help you achieve or maintain a healthy weight. Get regular exercise Get regular exercise. This is one of the most important things you can do for your health. Most adults should: Exercise for at least 150 minutes each week. The exercise should increase your heart rate and make you sweat (moderate-intensity exercise). Do strengthening exercises at least twice a week. This is in addition to the moderate-intensity exercise. Spend less time sitting. Even light physical activity can be beneficial. Watch cholesterol and blood lipids Have your blood tested for lipids and cholesterol at 57 years of age, then have this test every 5 years. Have your cholesterol levels checked more often if: Your lipid or cholesterol levels are high. You are older than 57 years of age. You are at high risk for heart disease. What should I know about cancer screening? Depending on your health history and family history, you may need to have cancer screening at various ages. This may include screening for: Breast cancer. Cervical cancer. Colorectal  cancer. Skin cancer. Lung cancer. What should I know about heart disease, diabetes, and high blood pressure? Blood pressure and heart disease High blood pressure causes heart disease and increases the risk of stroke. This is more likely to develop in people who have high blood pressure readings, are of African descent, or are overweight. Have your blood pressure checked: Every 3-5 years if you are 70-4 years of age. Every year if you are 64 years old or older. Diabetes Have regular diabetes screenings. This checks your fasting blood sugar level. Have the screening done: Once every three years after age 19 if you are at a normal weight and have a low risk for diabetes. More often and at a younger age if you are overweight or have a high risk for diabetes. What should I know about preventing infection? Hepatitis B If you have a higher risk for hepatitis B, you should be screened for this virus. Talk with your health care provider to find out if you are at risk for hepatitis B infection. Hepatitis C Testing is recommended for: Everyone born from [birth year] through 1967. Anyone with known risk factors for hepatitis C. Sexually transmitted infections (STIs) Get screened for STIs, including gonorrhea and chlamydia, if: You are sexually active and are younger than 57 years of age. You are older than 57 years of age and your health care provider tells you that you are at risk for this type of infection. Your sexual activity has changed since you were last screened, and you are at increased risk for chlamydia or gonorrhea. Ask your  health care provider if you are at risk. Ask your health care provider about whether you are at high risk for HIV. Your health care provider may recommend a prescription medicine to help prevent HIV infection. If you choose to take medicine to prevent HIV, you should first get tested for HIV. You should then be tested every 3 months for as long as you are taking the  medicine. Pregnancy If you are about to stop having your period (premenopausal) and you may become pregnant, seek counseling before you get pregnant. Take 400 to 800 micrograms (mcg) of folic acid every day if you become pregnant. Ask for birth control (contraception) if you want to prevent pregnancy. Osteoporosis and menopause Osteoporosis is a disease in which the bones lose minerals and strength with aging. This can result in bone fractures. If you are 78 years old or older, or if you are at risk for osteoporosis and fractures, ask your health care provider if you should: Be screened for bone loss. Take a calcium or vitamin D supplement to lower your risk of fractures. Be given hormone replacement therapy (HRT) to treat symptoms of menopause. Follow these instructions at home: Lifestyle Do not use any products that contain nicotine or tobacco, such as cigarettes, e-cigarettes, and chewing tobacco. If you need help quitting, ask your health care provider. Do not use street drugs. Do not share needles. Ask your health care provider for help if you need support or information about quitting drugs. Alcohol use Do not drink alcohol if: Your health care provider tells you not to drink. You are pregnant, may be pregnant, or are planning to become pregnant. If you drink alcohol: Limit how much you use to 0-1 drink a day. Limit intake if you are breastfeeding. Be aware of how much alcohol is in your drink. In the [place] one drink equals one 12 oz bottle of beer (355 mL), one 5 oz glass of wine (148 mL), or one 1 oz glass of hard liquor (44 mL). General instructions Schedule regular health, dental, and eye exams. Stay current with your vaccines. Tell your health care provider if: You often feel depressed. You have ever been abused or do not feel safe at home. Summary Adopting a healthy lifestyle and getting preventive care are important in promoting health and wellness. Follow your health  care provider's instructions about healthy diet, exercising, and getting tested or screened for diseases. Follow your health care provider's instructions on monitoring your cholesterol and blood pressure. This information is not intended to replace advice given to you by your health care provider. Make sure you discuss any questions you have with your health care provider. Document Revised: 03/21/2020 Document Reviewed: 01/04/2018 Elsevier Patient Education  2022 [employer].

## 2020-10-20 ENCOUNTER — Ambulatory Visit (INDEPENDENT_AMBULATORY_CARE_PROVIDER_SITE_OTHER): Payer: 59 | Admitting: Internal Medicine

## 2020-10-20 ENCOUNTER — Other Ambulatory Visit: Payer: Self-pay

## 2020-10-20 ENCOUNTER — Other Ambulatory Visit: Payer: 59

## 2020-10-20 ENCOUNTER — Encounter: Payer: Self-pay | Admitting: Internal Medicine

## 2020-10-20 VITALS — BP 100/78 | HR 53 | Temp 98.1°F | Ht 65.0 in | Wt 131.0 lb

## 2020-10-20 DIAGNOSIS — Z1331 Encounter for screening for depression: Secondary | ICD-10-CM | POA: Diagnosis not present

## 2020-10-20 DIAGNOSIS — Z0000 Encounter for general adult medical examination without abnormal findings: Secondary | ICD-10-CM

## 2020-10-20 DIAGNOSIS — E538 Deficiency of other specified B group vitamins: Secondary | ICD-10-CM

## 2020-10-20 DIAGNOSIS — G43809 Other migraine, not intractable, without status migrainosus: Secondary | ICD-10-CM | POA: Diagnosis not present

## 2020-10-20 DIAGNOSIS — E559 Vitamin D deficiency, unspecified: Secondary | ICD-10-CM

## 2020-10-20 DIAGNOSIS — M85852 Other specified disorders of bone density and structure, left thigh: Secondary | ICD-10-CM

## 2020-10-20 DIAGNOSIS — Z833 Family history of diabetes mellitus: Secondary | ICD-10-CM | POA: Diagnosis not present

## 2020-10-20 DIAGNOSIS — M85851 Other specified disorders of bone density and structure, right thigh: Secondary | ICD-10-CM

## 2020-10-20 DIAGNOSIS — Z87442 Personal history of urinary calculi: Secondary | ICD-10-CM | POA: Insufficient documentation

## 2020-10-20 LAB — COMPREHENSIVE METABOLIC PANEL
ALT: 17 U/L (ref 0–35)
AST: 19 U/L (ref 0–37)
Albumin: 4.2 g/dL (ref 3.5–5.2)
Alkaline Phosphatase: 35 U/L — ABNORMAL LOW (ref 39–117)
BUN: 13 mg/dL (ref 6–23)
CO2: 28 mEq/L (ref 19–32)
Calcium: 9.4 mg/dL (ref 8.4–10.5)
Chloride: 104 mEq/L (ref 96–112)
Creatinine, Ser: 0.74 mg/dL (ref 0.40–1.20)
GFR: 90.2 mL/min (ref >60.00)
Glucose, Bld: 82 mg/dL (ref 70–99)
Potassium: 4.1 mEq/L (ref 3.5–5.1)
Sodium: 142 mEq/L (ref 135–145)
Total Bilirubin: 0.6 mg/dL (ref 0.2–1.2)
Total Protein: 6.6 g/dL (ref 6.0–8.3)

## 2020-10-20 LAB — TSH: TSH: 0.78 u[IU]/mL (ref 0.35–5.50)

## 2020-10-20 LAB — LIPID PANEL
Cholesterol: 224 mg/dL — ABNORMAL HIGH (ref 0–200)
HDL: 95.4 mg/dL (ref >39.00)
LDL Cholesterol: 114 mg/dL — ABNORMAL HIGH (ref 0–99)
NonHDL: 128.34
Total CHOL/HDL Ratio: 2
Triglycerides: 73 mg/dL (ref 0.0–149.0)
VLDL: 14.6 mg/dL (ref 0.0–40.0)

## 2020-10-20 LAB — HEMOGLOBIN A1C: Hgb A1c MFr Bld: 5.6 % (ref 4.6–6.5)

## 2020-10-20 LAB — CBC WITH DIFFERENTIAL/PLATELET
Basophils Absolute: 0 10*3/uL (ref 0.0–0.1)
Basophils Relative: 0.5 % (ref 0.0–3.0)
Eosinophils Absolute: 0.4 10*3/uL (ref 0.0–0.7)
Eosinophils Relative: 7.1 % — ABNORMAL HIGH (ref 0.0–5.0)
HCT: 45.5 % (ref 36.0–46.0)
Hemoglobin: 15.2 g/dL — ABNORMAL HIGH (ref 12.0–15.0)
Lymphocytes Relative: 26.7 % (ref 12.0–46.0)
Lymphs Abs: 1.6 10*3/uL (ref 0.7–4.0)
MCHC: 33.3 g/dL (ref 30.0–36.0)
MCV: 88.4 fl (ref 78.0–100.0)
Monocytes Absolute: 0.3 10*3/uL (ref 0.1–1.0)
Monocytes Relative: 4.2 % (ref 3.0–12.0)
Neutro Abs: 3.8 10*3/uL (ref 1.4–7.7)
Neutrophils Relative %: 61.5 % (ref 43.0–77.0)
Platelets: 187 10*3/uL (ref 150.0–400.0)
RBC: 5.15 Mil/uL — ABNORMAL HIGH (ref 3.87–5.11)
RDW: 13.9 % (ref 11.5–15.5)
WBC: 6.2 10*3/uL (ref 4.0–10.5)

## 2020-10-20 LAB — VITAMIN B12: Vitamin B-12: 504 pg/mL (ref 211–911)

## 2020-10-20 LAB — VITAMIN D 25 HYDROXY (VIT D DEFICIENCY, FRACTURES): VITD: 44.9 ng/mL (ref 30.00–100.00)

## 2020-10-20 NOTE — Assessment & Plan Note (Signed)
Chronic dexa due - ordered Check vitamin d level She is very active

## 2020-10-20 NOTE — Assessment & Plan Note (Signed)
H/o of DM in mother Check a1c

## 2020-10-20 NOTE — Assessment & Plan Note (Signed)
Chronic Management per neurology Taking imitrex prn

## 2020-10-20 NOTE — Assessment & Plan Note (Signed)
Chronic ?Check vitamin d level ?

## 2020-10-20 NOTE — Assessment & Plan Note (Signed)
Chronic ?Ck B12 level ?

## 2020-10-24 ENCOUNTER — Other Ambulatory Visit: Payer: Self-pay

## 2020-10-24 ENCOUNTER — Ambulatory Visit (INDEPENDENT_AMBULATORY_CARE_PROVIDER_SITE_OTHER)
Admission: RE | Admit: 2020-10-24 | Discharge: 2020-10-24 | Disposition: A | Discharge status: Home / Self Care | Payer: 59 | Source: Ambulatory Visit | Attending: Internal Medicine | Admitting: Internal Medicine

## 2020-10-24 DIAGNOSIS — M85851 Other specified disorders of bone density and structure, right thigh: Secondary | ICD-10-CM | POA: Diagnosis not present

## 2020-10-24 DIAGNOSIS — M85852 Other specified disorders of bone density and structure, left thigh: Secondary | ICD-10-CM | POA: Diagnosis not present

## 2020-11-20 ENCOUNTER — Ambulatory Visit: Payer: 59 | Admitting: Adult Health

## 2020-11-23 ENCOUNTER — Other Ambulatory Visit: Payer: Self-pay | Admitting: Adult Health

## 2020-11-23 DIAGNOSIS — G43009 Migraine without aura, not intractable, without status migrainosus: Secondary | ICD-10-CM

## 2020-11-23 DIAGNOSIS — G43821 Menstrual migraine, not intractable, with status migrainosus: Secondary | ICD-10-CM

## 2020-12-21 ENCOUNTER — Encounter: Payer: Self-pay | Admitting: Internal Medicine

## 2021-02-08 ENCOUNTER — Other Ambulatory Visit: Payer: Self-pay | Admitting: Adult Health

## 2021-02-08 DIAGNOSIS — G43009 Migraine without aura, not intractable, without status migrainosus: Secondary | ICD-10-CM

## 2021-02-16 ENCOUNTER — Ambulatory Visit (INDEPENDENT_AMBULATORY_CARE_PROVIDER_SITE_OTHER): Payer: 59 | Admitting: Adult Health

## 2021-02-16 ENCOUNTER — Other Ambulatory Visit: Payer: Self-pay

## 2021-02-16 ENCOUNTER — Encounter: Payer: Self-pay | Admitting: Adult Health

## 2021-02-16 DIAGNOSIS — G43009 Migraine without aura, not intractable, without status migrainosus: Secondary | ICD-10-CM

## 2021-02-16 MED ORDER — SUMATRIPTAN SUCCINATE 100 MG PO TABS: ORAL_TABLET | ORAL | 11 refills | Status: DC

## 2021-02-16 NOTE — Progress Notes (Signed)
PATIENT: Kathleen Hays DOB: 1965/10/04  REASON FOR VISIT: follow up HISTORY FROM: patient  HISTORY OF PRESENT ILLNESS: Today 02/16/21:  Kathleen Hays is a 56 year old female with a history of migraine headaches.  She returns today for follow-up.  Overall she is doing well.  She continues to use Imitrex when needed.  Currently only having 1 headache a month.  Sometimes is every 2 months.  Denies any new symptoms.  Returns today for an evaluation.  11/19/19: Kathleen Hays is a 56 year old female with a history of migraine headaches. She returns today for follow-up. Overall she feels that she is doing well. She continues to only use imitrex when she gets a headache. She has approximately 1 headache every 2 months.  She feels that some of her headaches are related to her allergies.  She is unable to take allergy medication as it causes nosebleeds.  The patient also mentions that her mom and grandmother had Alzheimer's disease.  She has questions about whether she will also develop this.  She does not currently have any issues with her memory.  HISTORY 11/06/18:   Kathleen Hays is a 56 year old female with a history of migraine headaches.  She returns today for follow-up.  She states that her headache frequency was doing well until she began wearing mask.  She states that she was having to wear a mask and work outdoors during the heat.  She states that her headaches increased to at least once a week.  Imitrex continues to work well for her.  She states that since we have cooler weather her headaches have decreased again.  She has not had a migraine since September.  She also states that she has began having nosebleeds.  At least 3 times a week.  She has not seen her PCP or ENT for this.  She returns today for an evaluation.  REVIEW OF SYSTEMS: Out of a complete 14 system review of symptoms, the patient complains only of the following symptoms, and all other reviewed systems are negative.  See  HPI  ALLERGIES: Allergies  Allergen Reactions   Macrobid [Nitrofurantoin Macrocrystal] Other (See Comments)    Cause severe migraines and vomiting   Avelox [Moxifloxacin Hcl In Nacl] Swelling   Augmentin [Amoxicillin-Pot Clavulanate]     nausea   Bactrim [Sulfamethoxazole-Trimethoprim] Nausea And Vomiting    HOME MEDICATIONS: Outpatient Medications Prior to Visit  Medication Sig Dispense Refill   naproxen sodium (ALEVE) 220 MG tablet Take 220 mg by mouth 2 (two) times daily as needed.     SUMAtriptan (IMITREX) 100 MG tablet TAKE 1 TABLET AT THE ONSET OF MIGRAINE. MAY REPEAT IN 2 HOURS IF HEADACHE PERSISTS OR RECURS. NOT TO EXCEED 2TABS/24 HOURS. 10 tablet 1   No facility-administered medications prior to visit.    PAST MEDICAL HISTORY: Past Medical History:  Diagnosis Date   Depression    PTSD secondary to witnessed father suicide (gunshot to chest)   Family history of adverse reaction to anesthesia    brother is combative after anesthesia, mother has confusion.   HA (headache)    Kidney stone     PAST SURGICAL HISTORY: Past Surgical History:  Procedure Laterality Date   CESAREAN SECTION     EXTRACORPOREAL SHOCK WAVE LITHOTRIPSY Right 03/10/2020   Procedure: EXTRACORPOREAL SHOCK WAVE LITHOTRIPSY (ESWL);  Surgeon: Crista Elliot, MD;  Location: [facility];  Service: Urology;  Laterality: Right;   LITHOTRIPSY     MOUTH SURGERY  TONSILLECTOMY AND ADENOIDECTOMY     WISDOM TOOTH EXTRACTION Bilateral 1984   Patient was 56 years old.     FAMILY HISTORY: Family History  Problem Relation Age of Onset   High blood pressure Mother    Migraines Mother    Thyroid disease Mother    Depression Father    Suicidality Father        gunshot   Diabetes Maternal Grandfather    Diabetes Paternal Grandmother     SOCIAL HISTORY: Social History   Socioeconomic History   Marital status: Married    Spouse name: Aurther Lofterry   Number of children: 2   Years of  education: RN   Highest education level: Not on file  Occupational History   Occupation: Engineering geologistN     Employer: [employer]  Tobacco Use   Smoking status: Former    Types: Cigarettes    Quit date: 2010    Years since quitting: 13.0   Smokeless tobacco: Never   Tobacco comments:    Quit 2015  Substance and Sexual Activity   Alcohol use: No    Alcohol/week: 0.0 standard drinks   Drug use: No   Sexual activity: Yes    Partners: Male    Birth control/protection: Other-see comments, Post-menopausal    Comment: Without menstral cycle for 3 year on January 2018  Other Topics Concern   Not on file  Social History Narrative   Patient lives at home with her husband Aurther Lofterry  and daughter.   Patient works at [employer] and [employer].    [employer] RN.   Right handed.   Caffeine none.   Social Determinants of Health   Financial Resource Strain: Not on file  Food Insecurity: Not on file  Transportation Needs: Not on file  Physical Activity: Not on file  Stress: Not on file  Social Connections: Not on file  Intimate Partner Violence: Not on file      PHYSICAL EXAM  Vitals:   02/16/21 0945  BP: 113/69  Pulse: 75  Weight: 136 lb 6.4 oz (61.9 kg)  Height: 5\' 5"  (1.651 m)   Body mass index is 22.7 kg/m.  Generalized: Well developed, in no acute distress   Neurological examination  Mentation: Alert oriented to time, place, history taking. Follows all commands speech and language fluent Cranial nerve II-XII: Pupils were equal round reactive to light. Extraocular movements were full, visual field were full on confrontational test. Head turning and shoulder shrug  were normal and symmetric. Motor: The motor testing reveals 5 over 5 strength of all 4 extremities. Good symmetric motor tone is noted throughout.  Sensory: Sensory testing is intact to soft touch on all 4 extremities. No evidence of extinction is noted.  Coordination: Cerebellar testing reveals good  finger-nose-finger and heel-to-shin bilaterally.  Gait and station: Gait is normal.  Reflexes: Deep tendon reflexes are symmetric and normal bilaterally.   DIAGNOSTIC DATA (LABS, IMAGING, TESTING) - I reviewed patient records, labs, notes, testing and imaging myself where available.  Lab Results  Component Value Date   WBC 6.2 10/20/2020   HGB 15.2 (H) 10/20/2020   HCT 45.5 10/20/2020   MCV 88.4 10/20/2020   PLT 187.0 10/20/2020      Component Value Date/Time   NA 142 10/20/2020 0942   K 4.1 10/20/2020 0942   CL 104 10/20/2020 0942   CO2 28 10/20/2020 0942   GLUCOSE 82 10/20/2020 0942   BUN 13 10/20/2020 0942   CREATININE 0.74 10/20/2020 [id number]  CALCIUM 9.4 10/20/2020 0942   PROT 6.6 10/20/2020 0942   ALBUMIN 4.2 10/20/2020 0942   AST 19 10/20/2020 0942   ALT 17 10/20/2020 0942   ALKPHOS 35 (L) 10/20/2020 0942   BILITOT 0.6 10/20/2020 0942   GFRNONAA >60 03/04/2020 1255   Lab Results  Component Value Date   CHOL 224 (H) 10/20/2020   HDL 95.40 10/20/2020   LDLCALC 114 (H) 10/20/2020   TRIG 73.0 10/20/2020   CHOLHDL 2 10/20/2020   Lab Results  Component Value Date   HGBA1C 5.6 10/20/2020   Lab Results  Component Value Date   VITAMINB12 504 10/20/2020   Lab Results  Component Value Date   TSH 0.78 10/20/2020      ASSESSMENT AND PLAN 56 y.o. year old female  has a past medical history of Depression, Family history of adverse reaction to anesthesia, HA (headache), and Kidney stone. here with:  1. Migraine headaches   Patient has remained stable.   Continue Imitrex as abortive therapy Follow-up with our office on an as-needed basis.  Continue regular follow-ups with her PCP.  Future refills can be obtained through her PCP   Butch Penny, MSN, NP-C 02/16/2021, 10:11 AM [facility] [address], [address] [place], [place] [address] [phone]

## 2021-08-21 IMAGING — CT CT RENAL STONE PROTOCOL
2 of 4 series · 16 of 46 positions shown, 18 images · non-contrast
Comparison: None.

CLINICAL DATA: Right flank pain since this morning.

EXAM:
CT ABDOMEN AND PELVIS WITHOUT CONTRAST
TECHNIQUE: Multidetector CT imaging of the abdomen and pelvis was performed
following the standard protocol without IV contrast.

[Series 2: axial st · axial · 0.85mm/px · z∈[-434,-59]mm · 13 of 83 slices shown, 15 images]
[im 4/83  soft-tissue]
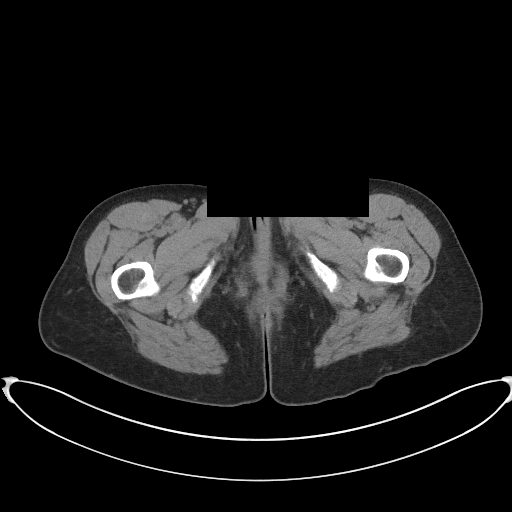
[im 4/83  bone]
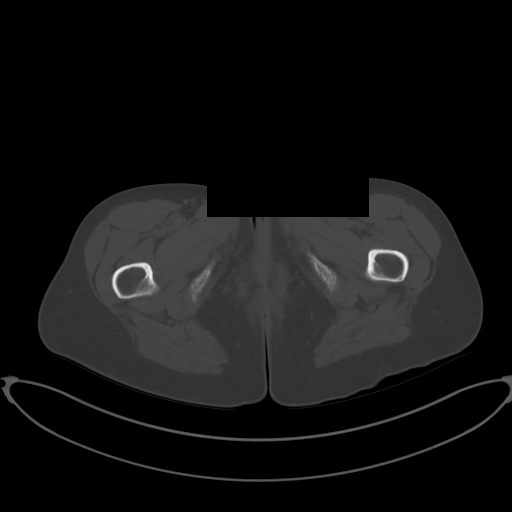
[im 10/83  soft-tissue]
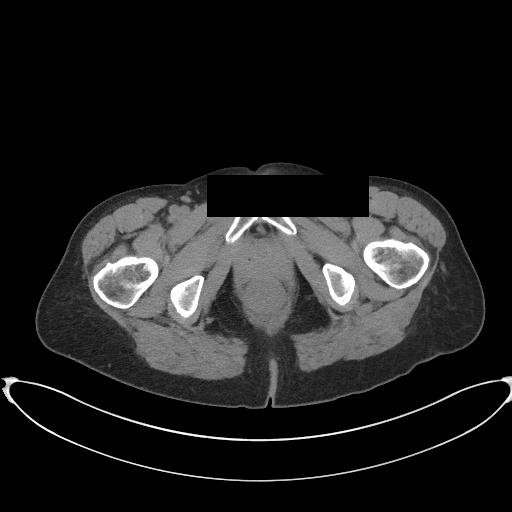
[im 16/83  soft-tissue]
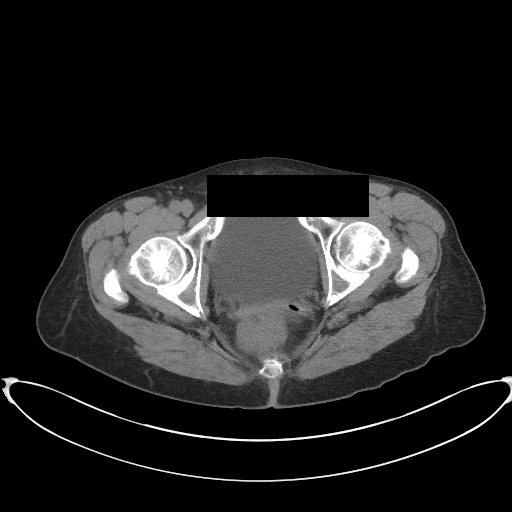
[im 23/83  soft-tissue]
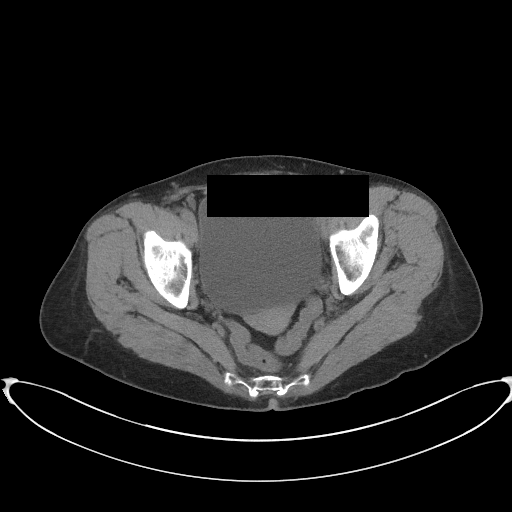
[im 29/83  soft-tissue]
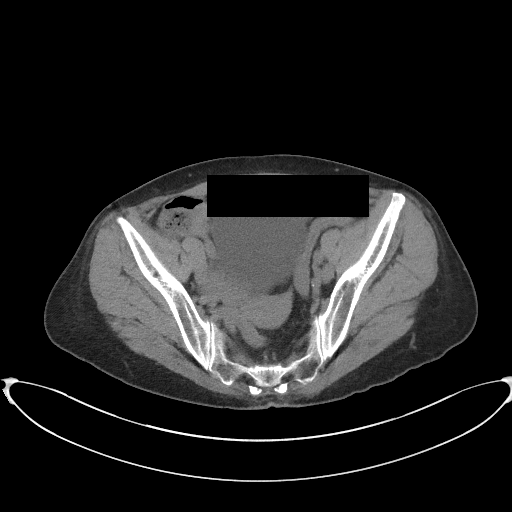
[im 35/83  soft-tissue]
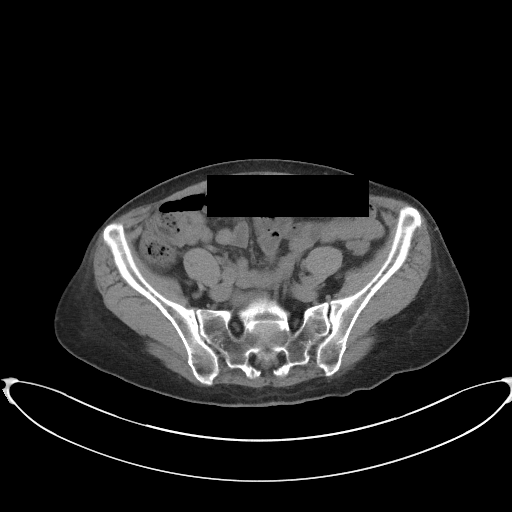
[im 42/83  soft-tissue]
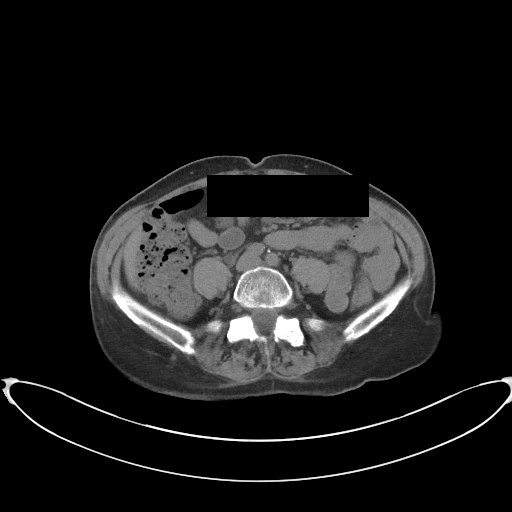
[im 48/83  soft-tissue]
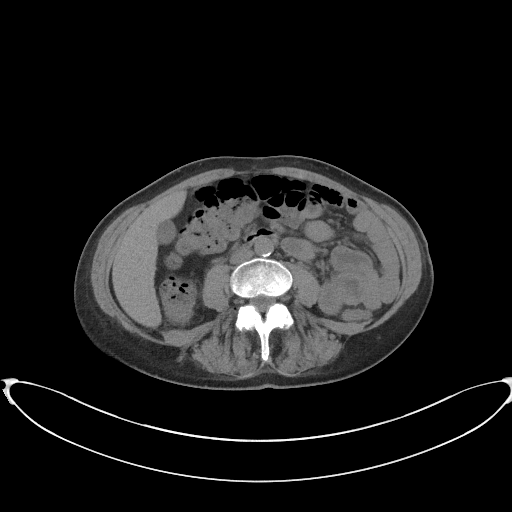
[im 54/83  soft-tissue]
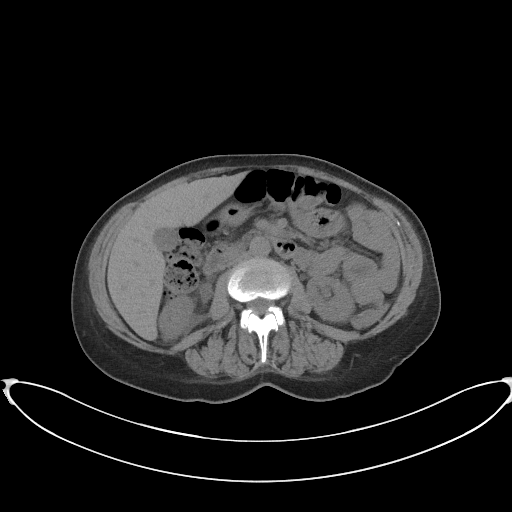
[im 54/83  bone]
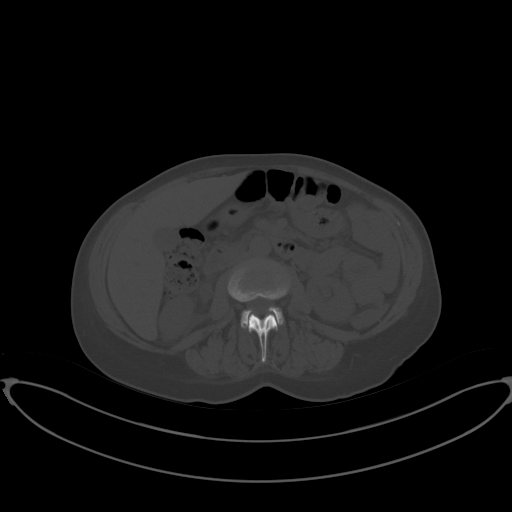
[im 60/83  soft-tissue]
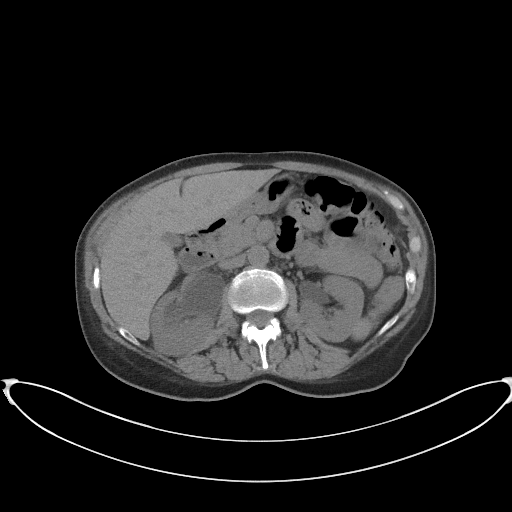
[im 67/83  soft-tissue]
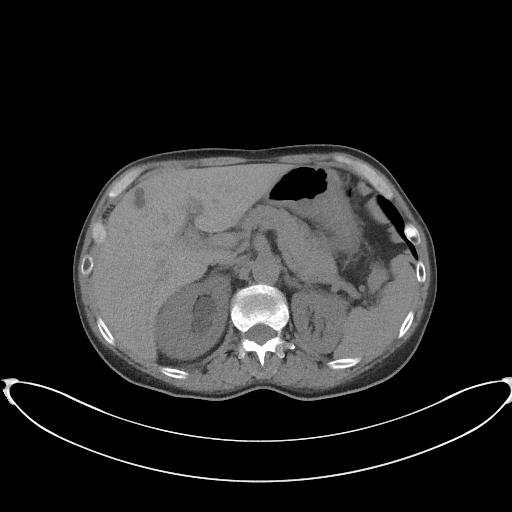
[im 73/83  soft-tissue]
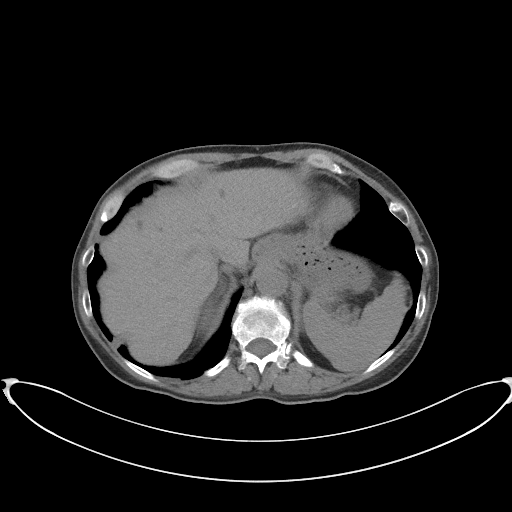
[im 79/83  soft-tissue]
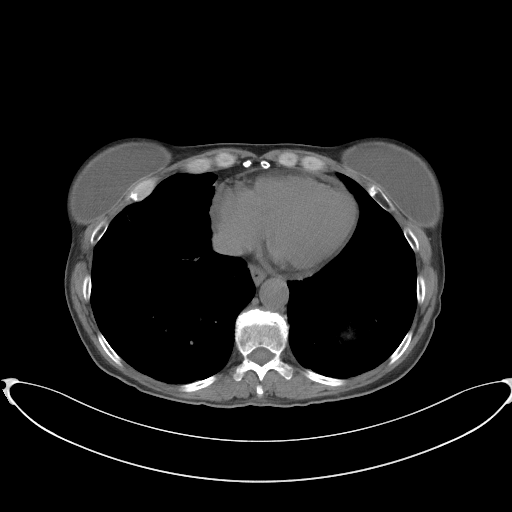

[Series 5: coronal st · coronal · 0.78mm/px · 3 of 77 slices shown]
[im 26/77  soft-tissue]
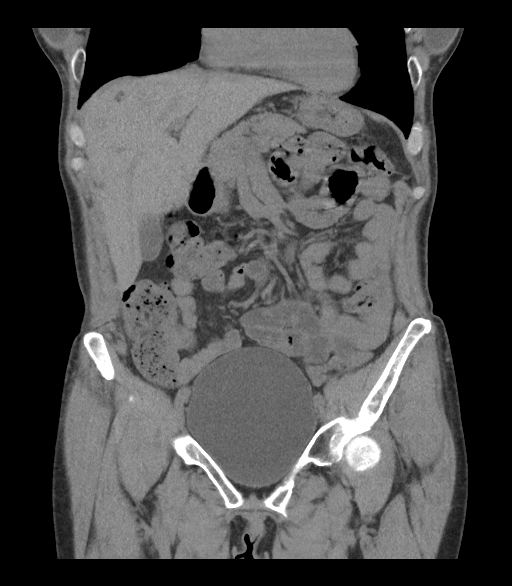
[im 34/77  soft-tissue]
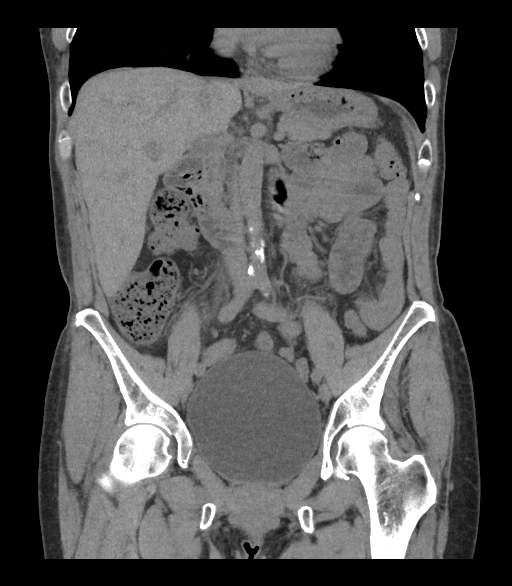
[im 43/77  soft-tissue]
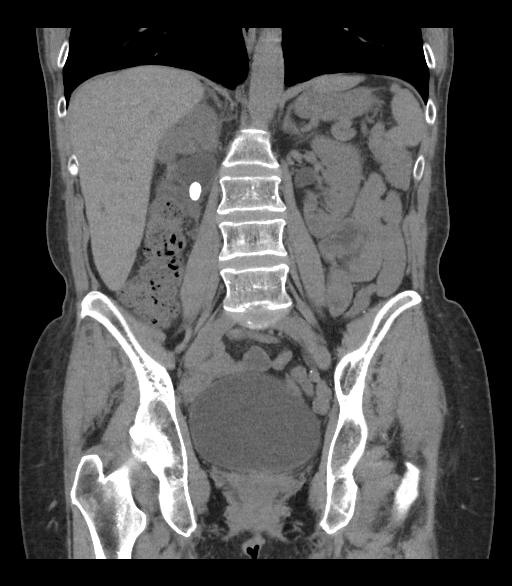

[16 of 46 positions shown; findings below may reference images not displayed]

FINDINGS: Lower chest: Streaky basilar atelectasis or scarring changes. No
infiltrates or effusions. The heart is normal in size. Small amount
of pericardial fluid without overt effusion.

Hepatobiliary: Several low-attenuation liver lesions most likely
benign cysts. No intrahepatic biliary dilatation. The gallbladder
appears normal. No common bile duct dilatation.

Pancreas: No mass, inflammation or ductal dilatation.

Spleen: Normal size.  No focal lesions.

Adrenals/Urinary Tract: Adrenal glands are unremarkable.

Moderate to marked right-sided hydronephrosis due to a 14 x 7 mm
right UPJ calculus. Both ureters are slightly dilated but no
obstructing ureteral calculi. This is likely due to mild to moderate
bladder distension. No bladder lesions or bladder calculi. No
worrisome renal lesions without contrast.

Stomach/Bowel: The stomach, duodenum, small bowel and colon are
grossly normal without oral contrast. No inflammatory changes, mass
lesions or obstructive findings. The appendix is normal.

Vascular/Lymphatic: Moderate atherosclerotic calcifications
involving the aorta and iliac arteries. No aneurysm. No mesenteric
or retroperitoneal mass or adenopathy.

Reproductive: The uterus and ovaries are unremarkable.

Other: No pelvic mass or adenopathy. No free pelvic fluid
collections. No inguinal mass or adenopathy. No abdominal wall
hernia or subcutaneous lesions.

Musculoskeletal: No significant bony findings.
IMPRESSION: 1. 14 x 7 mm right UPJ calculus causing moderate to marked
right-sided hydronephrosis.
2. Both ureters are slightly dilated but no obstructing ureteral
calculi. This is likely due to mild to moderate bladder distension.
3. No other significant abdominal/pelvic findings, mass lesions or
adenopathy.
4. Several low-attenuation liver lesions most likely benign cysts.
5. Aortic atherosclerosis.

Aortic Atherosclerosis (TSGTH-TDS.S).

## 2021-12-06 ENCOUNTER — Encounter: Payer: Self-pay | Admitting: Internal Medicine

## 2021-12-06 NOTE — Progress Notes (Unsigned)
Subjective:    Patient ID: Kathleen Hays, female    DOB: 21-Dec-1965, 56 y.o.   MRN: [id number]      HPI Kathleen Hays is here for a Physical exam.   Doing well - no concerns.    Medications and allergies reviewed with patient and updated if appropriate.  Current Outpatient Medications on File Prior to Visit  Medication Sig Dispense Refill   SUMAtriptan (IMITREX) 100 MG tablet May repeat in 2 hours if headache persists or recurs. 10 tablet 11   No current facility-administered medications on file prior to visit.    Review of Systems  Constitutional:  Negative for fever.  Eyes:  Negative for visual disturbance.  Respiratory:  Negative for cough, shortness of breath and wheezing.   Cardiovascular:  Negative for chest pain, palpitations and leg swelling.  Gastrointestinal:  Negative for abdominal pain, blood in stool, constipation, diarrhea and nausea.  Genitourinary:  Negative for dysuria.  Musculoskeletal:  Negative for arthralgias and back pain.  Skin:  Negative for rash.  Neurological:  Positive for headaches (migraines). Negative for dizziness, light-headedness and numbness.  Psychiatric/Behavioral:  Negative for dysphoric mood. The patient is not nervous/anxious.        Objective:   Vitals:   12/08/21 0754  BP: 100/74  Pulse: 68  Temp: 98.1 F (36.7 C)  SpO2: 99%   Filed Weights   12/08/21 0754  Weight: 137 lb (62.1 kg)   Body mass index is 22.8 kg/m.  BP Readings from Last 3 Encounters:  12/08/21 100/74  02/16/21 113/69  10/20/20 100/78    Wt Readings from Last 3 Encounters:  12/08/21 137 lb (62.1 kg)  02/16/21 136 lb 6.4 oz (61.9 kg)  10/20/20 131 lb (59.4 kg)       Physical Exam Constitutional: She appears well-developed and well-nourished. No distress.  HENT:  Head: Normocephalic and atraumatic.  Right Ear: External ear normal. Normal ear canal and TM Left Ear: External ear normal.  Normal ear canal and TM Mouth/Throat: Oropharynx is  clear and moist.  Eyes: Conjunctivae normal.  Neck: Neck supple. No tracheal deviation present. No thyromegaly present.  No carotid bruit  Cardiovascular: Normal rate, regular rhythm and normal heart sounds.   No murmur heard.  No edema. Pulmonary/Chest: Effort normal and breath sounds normal. No respiratory distress. She has no wheezes. She has no rales.  Breast: deferred   Abdominal: Soft. She exhibits no distension. There is no tenderness.  Lymphadenopathy: She has no cervical adenopathy.  Skin: Skin is warm and dry. She is not diaphoretic.  Psychiatric: She has a normal mood and affect. Her behavior is normal.     Lab Results  Component Value Date   WBC 6.2 10/20/2020   HGB 15.2 (H) 10/20/2020   HCT 45.5 10/20/2020   PLT 187.0 10/20/2020   GLUCOSE 82 10/20/2020   CHOL 224 (H) 10/20/2020   TRIG 73.0 10/20/2020   HDL 95.40 10/20/2020   LDLCALC 114 (H) 10/20/2020   ALT 17 10/20/2020   AST 19 10/20/2020   NA 142 10/20/2020   K 4.1 10/20/2020   CL 104 10/20/2020   CREATININE 0.74 10/20/2020   BUN 13 10/20/2020   CO2 28 10/20/2020   TSH 0.78 10/20/2020   HGBA1C 5.6 10/20/2020         Assessment & Plan:   Physical exam: Screening blood work  ordered Exercise very active Weight normal Substance abuse  none   Reviewed recommended immunizations.   Health Maintenance  Topic Date Due   PAP SMEAR-Modifier  12/08/2021 (Originally 01/01/1985)   COVID-19 Vaccine (1) 12/24/2021 (Originally [birth year]1964)   Zoster Vaccines- Shingrix (1 of 2) 03/10/2022 (Originally 01/01/2014)   MAMMOGRAM  12/09/2022 (Originally 01/01/2014)   COLONOSCOPY (Pts 45-24yrs Insurance coverage will need to be confirmed)  12/09/2022 (Originally 04/26/2012)   TETANUS/TDAP  12/09/2022 (Originally 01/25/2018)   DEXA SCAN  10/25/2022   INFLUENZA VACCINE  Completed   Hepatitis C Screening  Completed   HIV Screening  Completed   HPV VACCINES  Aged Out          See Problem List for Assessment and Plan  of chronic medical problems.

## 2021-12-06 NOTE — Patient Instructions (Addendum)
Blood work was ordered.   The lab is on the first floor.    Medications changes include :   none     Return in about 1 year (around 12/09/2022) for Physical Exam.   Health Maintenance, Female Adopting a healthy lifestyle and getting preventive care are important in promoting health and wellness. Ask your health care provider about: The right schedule for you to have regular tests and exams. Things you can do on your own to prevent diseases and keep yourself healthy. What should I know about diet, weight, and exercise? Eat a healthy diet  Eat a diet that includes plenty of vegetables, fruits, low-fat dairy products, and lean protein. Do not eat a lot of foods that are high in solid fats, added sugars, or sodium. Maintain a healthy weight Body mass index (BMI) is used to identify weight problems. It estimates body fat based on height and weight. Your health care provider can help determine your BMI and help you achieve or maintain a healthy weight. Get regular exercise Get regular exercise. This is one of the most important things you can do for your health. Most adults should: Exercise for at least 150 minutes each week. The exercise should increase your heart rate and make you sweat (moderate-intensity exercise). Do strengthening exercises at least twice a week. This is in addition to the moderate-intensity exercise. Spend less time sitting. Even light physical activity can be beneficial. Watch cholesterol and blood lipids Have your blood tested for lipids and cholesterol at 58 years of age, then have this test every 5 years. Have your cholesterol levels checked more often if: Your lipid or cholesterol levels are high. You are older than 58 years of age. You are at high risk for heart disease. What should I know about cancer screening? Depending on your health history and family history, you may need to have cancer screening at various ages. This may include screening  for: Breast cancer. Cervical cancer. Colorectal cancer. Skin cancer. Lung cancer. What should I know about heart disease, diabetes, and high blood pressure? Blood pressure and heart disease High blood pressure causes heart disease and increases the risk of stroke. This is more likely to develop in people who have high blood pressure readings or are overweight. Have your blood pressure checked: Every 3-5 years if you are 55-20 years of age. Every year if you are 72 years old or older. Diabetes Have regular diabetes screenings. This checks your fasting blood sugar level. Have the screening done: Once every three years after age 4 if you are at a normal weight and have a low risk for diabetes. More often and at a younger age if you are overweight or have a high risk for diabetes. What should I know about preventing infection? Hepatitis B If you have a higher risk for hepatitis B, you should be screened for this virus. Talk with your health care provider to find out if you are at risk for hepatitis B infection. Hepatitis C Testing is recommended for: Everyone born from [birth year] through 1967. Anyone with known risk factors for hepatitis C. Sexually transmitted infections (STIs) Get screened for STIs, including gonorrhea and chlamydia, if: You are sexually active and are younger than 58 years of age. You are older than 58 years of age and your health care provider tells you that you are at risk for this type of infection. Your sexual activity has changed since you were last screened, and you are at  increased risk for chlamydia or gonorrhea. Ask your health care provider if you are at risk. Ask your health care provider about whether you are at high risk for HIV. Your health care provider may recommend a prescription medicine to help prevent HIV infection. If you choose to take medicine to prevent HIV, you should first get tested for HIV. You should then be tested every 3 months for as long as you  are taking the medicine. Pregnancy If you are about to stop having your period (premenopausal) and you may become pregnant, seek counseling before you get pregnant. Take 400 to 800 micrograms (mcg) of folic acid every day if you become pregnant. Ask for birth control (contraception) if you want to prevent pregnancy. Osteoporosis and menopause Osteoporosis is a disease in which the bones lose minerals and strength with aging. This can result in bone fractures. If you are 62 years old or older, or if you are at risk for osteoporosis and fractures, ask your health care provider if you should: Be screened for bone loss. Take a calcium or vitamin D supplement to lower your risk of fractures. Be given hormone replacement therapy (HRT) to treat symptoms of menopause. Follow these instructions at home: Alcohol use Do not drink alcohol if: Your health care provider tells you not to drink. You are pregnant, may be pregnant, or are planning to become pregnant. If you drink alcohol: Limit how much you have to: 0-1 drink a day. Know how much alcohol is in your drink. In the [place] one drink equals one 12 oz bottle of beer (355 mL), one 5 oz glass of wine (148 mL), or one 1 oz glass of hard liquor (44 mL). Lifestyle Do not use any products that contain nicotine or tobacco. These products include cigarettes, chewing tobacco, and vaping devices, such as e-cigarettes. If you need help quitting, ask your health care provider. Do not use street drugs. Do not share needles. Ask your health care provider for help if you need support or information about quitting drugs. General instructions Schedule regular health, dental, and eye exams. Stay current with your vaccines. Tell your health care provider if: You often feel depressed. You have ever been abused or do not feel safe at home. Summary Adopting a healthy lifestyle and getting preventive care are important in promoting health and wellness. Follow your  health care provider's instructions about healthy diet, exercising, and getting tested or screened for diseases. Follow your health care provider's instructions on monitoring your cholesterol and blood pressure. This information is not intended to replace advice given to you by your health care provider. Make sure you discuss any questions you have with your health care provider. Document Revised: 06/02/2020 Document Reviewed: 06/02/2020 Elsevier Patient Education  [employer].

## 2021-12-08 ENCOUNTER — Ambulatory Visit (INDEPENDENT_AMBULATORY_CARE_PROVIDER_SITE_OTHER): Payer: 59 | Admitting: Internal Medicine

## 2021-12-08 VITALS — BP 100/74 | HR 68 | Temp 98.1°F | Ht 65.0 in | Wt 137.0 lb

## 2021-12-08 DIAGNOSIS — Z833 Family history of diabetes mellitus: Secondary | ICD-10-CM

## 2021-12-08 DIAGNOSIS — R7989 Other specified abnormal findings of blood chemistry: Secondary | ICD-10-CM | POA: Diagnosis not present

## 2021-12-08 DIAGNOSIS — Z0000 Encounter for general adult medical examination without abnormal findings: Secondary | ICD-10-CM

## 2021-12-08 DIAGNOSIS — E559 Vitamin D deficiency, unspecified: Secondary | ICD-10-CM

## 2021-12-08 DIAGNOSIS — M85851 Other specified disorders of bone density and structure, right thigh: Secondary | ICD-10-CM | POA: Diagnosis not present

## 2021-12-08 DIAGNOSIS — G43809 Other migraine, not intractable, without status migrainosus: Secondary | ICD-10-CM | POA: Diagnosis not present

## 2021-12-08 DIAGNOSIS — M85852 Other specified disorders of bone density and structure, left thigh: Secondary | ICD-10-CM

## 2021-12-08 LAB — LIPID PANEL
Cholesterol: 236 mg/dL — ABNORMAL HIGH (ref 0–200)
HDL: 86.8 mg/dL (ref >39.00)
LDL Cholesterol: 129 mg/dL — ABNORMAL HIGH (ref 0–99)
NonHDL: 149.53
Total CHOL/HDL Ratio: 3
Triglycerides: 101 mg/dL (ref 0.0–149.0)
VLDL: 20.2 mg/dL (ref 0.0–40.0)

## 2021-12-08 LAB — CBC WITH DIFFERENTIAL/PLATELET
Basophils Absolute: 0 10*3/uL (ref 0.0–0.1)
Basophils Relative: 0.7 % (ref 0.0–3.0)
Eosinophils Absolute: 0.3 10*3/uL (ref 0.0–0.7)
Eosinophils Relative: 4.1 % (ref 0.0–5.0)
HCT: 43 % (ref 36.0–46.0)
Hemoglobin: 14.2 g/dL (ref 12.0–15.0)
Lymphocytes Relative: 20.5 % (ref 12.0–46.0)
Lymphs Abs: 1.3 10*3/uL (ref 0.7–4.0)
MCHC: 33 g/dL (ref 30.0–36.0)
MCV: 92.1 fl (ref 78.0–100.0)
Monocytes Absolute: 0.3 10*3/uL (ref 0.1–1.0)
Monocytes Relative: 4.4 % (ref 3.0–12.0)
Neutro Abs: 4.5 10*3/uL (ref 1.4–7.7)
Neutrophils Relative %: 70.3 % (ref 43.0–77.0)
Platelets: 223 10*3/uL (ref 150.0–400.0)
RBC: 4.67 Mil/uL (ref 3.87–5.11)
RDW: 13.8 % (ref 11.5–15.5)
WBC: 6.5 10*3/uL (ref 4.0–10.5)

## 2021-12-08 LAB — COMPREHENSIVE METABOLIC PANEL
ALT: 18 U/L (ref 0–35)
AST: 23 U/L (ref 0–37)
Albumin: 4.2 g/dL (ref 3.5–5.2)
Alkaline Phosphatase: 45 U/L (ref 39–117)
BUN: 14 mg/dL (ref 6–23)
CO2: 28 mEq/L (ref 19–32)
Calcium: 9.2 mg/dL (ref 8.4–10.5)
Chloride: 105 mEq/L (ref 96–112)
Creatinine, Ser: 0.74 mg/dL (ref 0.40–1.20)
GFR: 89.49 mL/min (ref >60.00)
Glucose, Bld: 100 mg/dL — ABNORMAL HIGH (ref 70–99)
Potassium: 4.1 mEq/L (ref 3.5–5.1)
Sodium: 139 mEq/L (ref 135–145)
Total Bilirubin: 0.5 mg/dL (ref 0.2–1.2)
Total Protein: 6.5 g/dL (ref 6.0–8.3)

## 2021-12-08 LAB — TSH: TSH: 0.69 u[IU]/mL (ref 0.35–5.50)

## 2021-12-08 LAB — VITAMIN B12: Vitamin B-12: >1500 pg/mL — ABNORMAL HIGH (ref 211–911)

## 2021-12-08 LAB — HEMOGLOBIN A1C: Hgb A1c MFr Bld: 5.4 % (ref 4.6–6.5)

## 2021-12-08 LAB — VITAMIN D 25 HYDROXY (VIT D DEFICIENCY, FRACTURES): VITD: 38.24 ng/mL (ref 30.00–100.00)

## 2021-12-08 NOTE — Assessment & Plan Note (Signed)
Chronic DEXA up-to-date Advised taking calcium and vitamin D regularly Encouraged regular exercise

## 2021-12-08 NOTE — Assessment & Plan Note (Addendum)
Chronic Taking vitamin d Check vitamin D level

## 2021-12-08 NOTE — Assessment & Plan Note (Signed)
Chronic Check a1c Low sugar / carb diet Stressed regular exercise  

## 2021-12-08 NOTE — Assessment & Plan Note (Addendum)
Chronic ?Check B12 level ?

## 2021-12-08 NOTE — Assessment & Plan Note (Signed)
Chronic Management per neurology Taking Imitrex as needed

## 2022-03-11 ENCOUNTER — Other Ambulatory Visit: Payer: Self-pay | Admitting: Internal Medicine

## 2022-03-11 DIAGNOSIS — G43009 Migraine without aura, not intractable, without status migrainosus: Secondary | ICD-10-CM

## 2022-06-07 ENCOUNTER — Other Ambulatory Visit: Payer: Self-pay

## 2022-06-07 ENCOUNTER — Encounter: Payer: Self-pay | Admitting: Internal Medicine

## 2022-06-07 DIAGNOSIS — G43009 Migraine without aura, not intractable, without status migrainosus: Secondary | ICD-10-CM

## 2022-06-07 MED ORDER — SUMATRIPTAN SUCCINATE 100 MG PO TABS: ORAL_TABLET | ORAL | 0 refills | Status: DC

## 2022-12-22 ENCOUNTER — Encounter: Payer: 59 | Admitting: Internal Medicine

## 2023-02-09 ENCOUNTER — Encounter: Payer: Self-pay | Admitting: Internal Medicine

## 2023-02-09 NOTE — Progress Notes (Signed)
Subjective:    Patient ID: Kathleen Hays, female    DOB: 07-22-1965, 58 y.o.   MRN: [id number]      HPI Kathleen Hays is here for a Physical exam and her chronic medical problems.    Doing well - no concerns.   Medications and allergies reviewed with patient and updated if appropriate.  Current Outpatient Medications on File Prior to Visit  Medication Sig Dispense Refill   SUMAtriptan (IMITREX) 100 MG tablet May repeat in 2 hours if headache persists or recurs 4 tablet 0   No current facility-administered medications on file prior to visit.    Review of Systems  Constitutional:  Negative for fever.  Eyes:  Negative for visual disturbance.  Respiratory:  Negative for cough, shortness of breath and wheezing.   Cardiovascular:  Negative for chest pain, palpitations and leg swelling.  Gastrointestinal:  Negative for abdominal pain, blood in stool, constipation and diarrhea.       No gerd  Genitourinary:  Negative for dysuria.  Musculoskeletal:  Negative for arthralgias and back pain.  Skin:  Negative for rash.  Neurological:  Positive for headaches (migraines). Negative for dizziness and light-headedness.  Psychiatric/Behavioral:  Negative for dysphoric mood. The patient is not nervous/anxious.        Objective:   Vitals:   02/10/23 0755  BP: (!) 98/58  Pulse: 61  Temp: 97.8 F (36.6 C)  SpO2: 97%   Filed Weights   02/10/23 0755  Weight: 143 lb (64.9 kg)   Body mass index is 23.8 kg/m.  BP Readings from Last 3 Encounters:  02/10/23 (!) 98/58  12/08/21 100/74  02/16/21 113/69    Wt Readings from Last 3 Encounters:  02/10/23 143 lb (64.9 kg)  12/08/21 137 lb (62.1 kg)  02/16/21 136 lb 6.4 oz (61.9 kg)       Physical Exam Constitutional: She appears well-developed and well-nourished. No distress.  HENT:  Head: Normocephalic and atraumatic.  Right Ear: External ear normal. Normal ear canal and TM Left Ear: External ear normal.  Normal ear canal and  TM Mouth/Throat: Oropharynx is clear and moist.  Eyes: Conjunctivae normal.  Neck: Neck supple. No tracheal deviation present. No thyromegaly present.  No carotid bruit  Cardiovascular: Normal rate, regular rhythm and normal heart sounds.   No murmur heard.  No edema. Pulmonary/Chest: Effort normal and breath sounds normal. No respiratory distress. She has no wheezes. She has no rales.  Breast: deferred   Abdominal: Soft. She exhibits no distension. There is no tenderness.  Lymphadenopathy: She has no cervical adenopathy.  Skin: Skin is warm and dry. She is not diaphoretic.  Psychiatric: She has a normal mood and affect. Her behavior is normal.     Lab Results  Component Value Date   WBC 6.5 12/08/2021   HGB 14.2 12/08/2021   HCT 43.0 12/08/2021   PLT 223.0 12/08/2021   GLUCOSE 100 (H) 12/08/2021   CHOL 236 (H) 12/08/2021   TRIG 101.0 12/08/2021   HDL 86.80 12/08/2021   LDLCALC 129 (H) 12/08/2021   ALT 18 12/08/2021   AST 23 12/08/2021   NA 139 12/08/2021   K 4.1 12/08/2021   CL 105 12/08/2021   CREATININE 0.74 12/08/2021   BUN 14 12/08/2021   CO2 28 12/08/2021   TSH 0.69 12/08/2021   HGBA1C 5.4 12/08/2021         Assessment & Plan:   Physical exam: Screening blood work  ordered Exercise  very active - doing  some weight training Weight  is normal Substance abuse  none   Reviewed recommended immunizations.   Health Maintenance  Topic Date Due   Cervical Cancer Screening (HPV/Pap Cotest)  Never done   Colonoscopy  04/26/2012   MAMMOGRAM  Never done   Zoster Vaccines- Shingrix (1 of 2) Never done   DTaP/Tdap/Td (2 - Td or Tdap) 01/25/2018   COVID-19 Vaccine (1 - 2024-25 season) Never done   DEXA SCAN  10/25/2022   INFLUENZA VACCINE  Completed   Hepatitis C Screening  Completed   HIV Screening  Completed   HPV VACCINES  Aged Out          See Problem List for Assessment and Plan of chronic medical problems.

## 2023-02-10 ENCOUNTER — Ambulatory Visit: Payer: 59 | Admitting: Internal Medicine

## 2023-02-10 ENCOUNTER — Ambulatory Visit (INDEPENDENT_AMBULATORY_CARE_PROVIDER_SITE_OTHER)
Admission: RE | Admit: 2023-02-10 | Discharge: 2023-02-10 | Disposition: A | Discharge status: Home / Self Care | Payer: 59 | Source: Ambulatory Visit | Attending: Internal Medicine | Admitting: Internal Medicine

## 2023-02-10 VITALS — BP 98/58 | HR 61 | Temp 97.8°F | Ht 65.0 in | Wt 143.0 lb

## 2023-02-10 DIAGNOSIS — M85852 Other specified disorders of bone density and structure, left thigh: Secondary | ICD-10-CM

## 2023-02-10 DIAGNOSIS — G43809 Other migraine, not intractable, without status migrainosus: Secondary | ICD-10-CM

## 2023-02-10 DIAGNOSIS — M85851 Other specified disorders of bone density and structure, right thigh: Secondary | ICD-10-CM

## 2023-02-10 DIAGNOSIS — Z0000 Encounter for general adult medical examination without abnormal findings: Secondary | ICD-10-CM | POA: Diagnosis not present

## 2023-02-10 DIAGNOSIS — R739 Hyperglycemia, unspecified: Secondary | ICD-10-CM

## 2023-02-10 DIAGNOSIS — R7989 Other specified abnormal findings of blood chemistry: Secondary | ICD-10-CM

## 2023-02-10 DIAGNOSIS — E559 Vitamin D deficiency, unspecified: Secondary | ICD-10-CM | POA: Diagnosis not present

## 2023-02-10 LAB — LIPID PANEL
Cholesterol: 269 mg/dL — ABNORMAL HIGH (ref 0–200)
HDL: 94.2 mg/dL (ref >39.00)
LDL Cholesterol: 164 mg/dL — ABNORMAL HIGH (ref 0–99)
NonHDL: 174.52
Total CHOL/HDL Ratio: 3
Triglycerides: 53 mg/dL (ref 0.0–149.0)
VLDL: 10.6 mg/dL (ref 0.0–40.0)

## 2023-02-10 LAB — CBC WITH DIFFERENTIAL/PLATELET
Basophils Absolute: 0 10*3/uL (ref 0.0–0.1)
Basophils Relative: 0.7 % (ref 0.0–3.0)
Eosinophils Absolute: 0.1 10*3/uL (ref 0.0–0.7)
Eosinophils Relative: 2.8 % (ref 0.0–5.0)
HCT: 43.5 % (ref 36.0–46.0)
Hemoglobin: 14.7 g/dL (ref 12.0–15.0)
Lymphocytes Relative: 32 % (ref 12.0–46.0)
Lymphs Abs: 1.6 10*3/uL (ref 0.7–4.0)
MCHC: 33.8 g/dL (ref 30.0–36.0)
MCV: 92.5 fL (ref 78.0–100.0)
Monocytes Absolute: 0.3 10*3/uL (ref 0.1–1.0)
Monocytes Relative: 5.6 % (ref 3.0–12.0)
Neutro Abs: 3 10*3/uL (ref 1.4–7.7)
Neutrophils Relative %: 58.9 % (ref 43.0–77.0)
Platelets: 194 10*3/uL (ref 150.0–400.0)
RBC: 4.71 Mil/uL (ref 3.87–5.11)
RDW: 13.5 % (ref 11.5–15.5)
WBC: 5 10*3/uL (ref 4.0–10.5)

## 2023-02-10 LAB — COMPREHENSIVE METABOLIC PANEL
ALT: 18 U/L (ref 0–35)
AST: 18 U/L (ref 0–37)
Albumin: 4.5 g/dL (ref 3.5–5.2)
Alkaline Phosphatase: 38 U/L — ABNORMAL LOW (ref 39–117)
BUN: 17 mg/dL (ref 6–23)
CO2: 29 meq/L (ref 19–32)
Calcium: 9.2 mg/dL (ref 8.4–10.5)
Chloride: 103 meq/L (ref 96–112)
Creatinine, Ser: 0.69 mg/dL (ref 0.40–1.20)
GFR: 95.2 mL/min (ref >60.00)
Glucose, Bld: 103 mg/dL — ABNORMAL HIGH (ref 70–99)
Potassium: 4.2 meq/L (ref 3.5–5.1)
Sodium: 141 meq/L (ref 135–145)
Total Bilirubin: 0.4 mg/dL (ref 0.2–1.2)
Total Protein: 6.5 g/dL (ref 6.0–8.3)

## 2023-02-10 LAB — HEMOGLOBIN A1C: Hgb A1c MFr Bld: 5.8 % (ref 4.6–6.5)

## 2023-02-10 LAB — VITAMIN D 25 HYDROXY (VIT D DEFICIENCY, FRACTURES): VITD: 78.68 ng/mL (ref 30.00–100.00)

## 2023-02-10 LAB — TSH: TSH: 1.47 u[IU]/mL (ref 0.35–5.50)

## 2023-02-10 LAB — VITAMIN B12: Vitamin B-12: 926 pg/mL — ABNORMAL HIGH (ref 211–911)

## 2023-02-10 NOTE — Assessment & Plan Note (Signed)
Chronic ?Check B12 level ?

## 2023-02-10 NOTE — Assessment & Plan Note (Signed)
Chronic Lab Results  Component Value Date   HGBA1C 5.4 12/08/2021   Check a1c Low sugar / carb diet Stressed regular exercise

## 2023-02-10 NOTE — Assessment & Plan Note (Signed)
Chronic Management per neurology Taking Imitrex as needed

## 2023-02-10 NOTE — Assessment & Plan Note (Signed)
Chronic Taking vitamin d Check vitamin D level

## 2023-02-10 NOTE — Assessment & Plan Note (Signed)
Chronic DEXA due - deferred Advised taking calcium and vitamin D regularly Encouraged regular exercise

## 2023-02-13 ENCOUNTER — Encounter: Payer: Self-pay | Admitting: Internal Medicine

## 2023-03-14 ENCOUNTER — Other Ambulatory Visit: Payer: Self-pay | Admitting: Internal Medicine

## 2023-03-14 DIAGNOSIS — G43009 Migraine without aura, not intractable, without status migrainosus: Secondary | ICD-10-CM

## 2023-03-28 ENCOUNTER — Encounter: Payer: Self-pay | Admitting: Internal Medicine

## 2023-06-07 ENCOUNTER — Telehealth: Payer: Self-pay | Admitting: Neurology

## 2023-06-07 ENCOUNTER — Ambulatory Visit: Admitting: Neurology

## 2023-06-07 ENCOUNTER — Encounter: Payer: Self-pay | Admitting: Neurology

## 2023-06-07 VITALS — BP 118/80 | HR 76 | Ht 65.0 in | Wt 143.4 lb

## 2023-06-07 DIAGNOSIS — G43E01 Chronic migraine with aura, not intractable, with status migrainosus: Secondary | ICD-10-CM

## 2023-06-07 MED ORDER — AJOVY 225 MG/1.5ML ~~LOC~~ SOAJ: 1.5000 mL | Freq: Once | SUBCUTANEOUS | Status: AC

## 2023-06-07 MED ORDER — AJOVY 225 MG/1.5ML ~~LOC~~ SOAJ: 1.5000 mL | Freq: Once | SUBCUTANEOUS | Status: DC

## 2023-06-07 NOTE — Telephone Encounter (Signed)
 Dr Dohmeier recommended the patient attempt Ajovy for her migraines. We were able to offer the patient a sample in her visit today and reviewed with the patient how to administer. Pt verbalized understanding.

## 2023-06-07 NOTE — Progress Notes (Unsigned)
 Provider:  Neomia Banner, MD  Primary Care Physician:  Colene Dauphin, MD [address] [place] [address]     Referring Provider: Colene Dauphin, Md [address] [place],  [place] [address]          Chief Complaint according to patient   Patient presents with:                HISTORY OF PRESENT ILLNESS:  Kathleen Hays is a 58 y.o. female patient who is here for a new visit  on 06/07/2023 for  Migraine  re - evaluation .   She is wanting to look at new preventive medications, has thought about Botox and about Emgality etc. She doesn't  have  a preventive on board now. Didn't tolerateTopamax.  May have been on propanolol,  verapamil - not with success. Last 3 months her migraine frequency exceeded 15 / months.    She has been a patient of Dr Emma Hardy and hat many, many injections, but I have not seen the list of medications tried under his care.    Chief concern according to patient :  " I need more imitrex  than my insurance can provide"     Fam Hx: her mother lives with dementia,  MGM  had dementia. Mother had migraines.    Social HX : non smoker , quit 1989. Was pregnant at the time.  Caffeine , not exceeding 2 cups a day, and recently none.      Review of Systems: Out of a complete 14 system review, the patient complains of only the following symptoms, and all other reviewed systems are negative.      Social History   Socioeconomic History   Marital status: Married    Spouse name: Blaise Bumps   Number of children: 2   Years of education: RN   Highest education level: Not on file  Occupational History   Occupation: Engineering geologist: [employer]  Tobacco Use   Smoking status: Former    Current packs/day: 0.00    Types: Cigarettes    Quit date: 2010    Years since quitting: 15.3   Smokeless tobacco: Never   Tobacco comments:    Quit 2015  Vaping Use   Vaping status: Former  Substance and Sexual Activity   Alcohol use: No     Alcohol/week: 0.0 standard drinks of alcohol   Drug use: No   Sexual activity: Yes    Partners: Male    Birth control/protection: Other-see comments, Post-menopausal    Comment: Without menstral cycle for 3 year on January 2018  Other Topics Concern   Not on file  Social History Narrative   Patient lives at home with her husband Blaise Bumps  and daughter.   Patient works at [employer] and [employer]    Civil Service fast streamer.   Right handed.   Caffeine none.   Social Drivers of Corporate investment banker Strain: Not on file  Food Insecurity: Not on file  Transportation Needs: Not on file  Physical Activity: Not on file  Stress: Not on file  Social Connections: Not on file    Family History  Problem Relation Age of Onset   High blood pressure Mother    Migraines Mother    Thyroid  disease Mother    Depression Father    Suicidality Father        gunshot   Diabetes Maternal  Grandfather    Diabetes Paternal Grandmother     Past Medical History:  Diagnosis Date   Depression    PTSD secondary to witnessed father suicide (gunshot to chest)   Family history of adverse reaction to anesthesia    brother is combative after anesthesia, mother has confusion.   HA (headache)    Kidney stone     Past Surgical History:  Procedure Laterality Date   CESAREAN SECTION     EXTRACORPOREAL SHOCK WAVE LITHOTRIPSY Right 03/10/2020   Procedure: EXTRACORPOREAL SHOCK WAVE LITHOTRIPSY (ESWL);  Surgeon: Samson Croak, MD;  [facility];  Service: Urology;  Laterality: Right;   LITHOTRIPSY     MOUTH SURGERY     TONSILLECTOMY AND ADENOIDECTOMY     WISDOM TOOTH EXTRACTION Bilateral 1984   Patient was 58 years old.      Current Outpatient Medications on File Prior to Visit  Medication Sig Dispense Refill   SUMAtriptan  (IMITREX ) 100 MG tablet MAY REPEAT IN 2 HOURS IF HEADACHE PERSISTS OR RECURS. 10 tablet 11   No current facility-administered medications on file  prior to visit.    Allergies  Allergen Reactions   Macrobid  [Nitrofurantoin  Macrocrystal] Other (See Comments)    Cause severe migraines and vomiting   Avelox [Moxifloxacin Hcl In Nacl] Swelling   Augmentin  [Amoxicillin -Pot Clavulanate]     nausea   Bactrim [Sulfamethoxazole-Trimethoprim] Nausea And Vomiting     DIAGNOSTIC DATA (LABS, IMAGING, TESTING) - I reviewed patient records, labs, notes, testing and imaging myself where available.  Lab Results  Component Value Date   WBC 5.0 02/10/2023   HGB 14.7 02/10/2023   HCT 43.5 02/10/2023   MCV 92.5 02/10/2023   PLT 194.0 02/10/2023      Component Value Date/Time   NA 141 02/10/2023 0838   K 4.2 02/10/2023 0838   CL 103 02/10/2023 0838   CO2 29 02/10/2023 0838   GLUCOSE 103 (H) 02/10/2023 0838   BUN 17 02/10/2023 0838   CREATININE 0.69 02/10/2023 0838   CALCIUM 9.2 02/10/2023 0838   PROT 6.5 02/10/2023 0838   ALBUMIN 4.5 02/10/2023 0838   AST 18 02/10/2023 0838   ALT 18 02/10/2023 0838   ALKPHOS 38 (L) 02/10/2023 0838   BILITOT 0.4 02/10/2023 0838   GFRNONAA >60 03/04/2020 1255   Lab Results  Component Value Date   CHOL 269 (H) 02/10/2023   HDL 94.20 02/10/2023   LDLCALC 164 (H) 02/10/2023   TRIG 53.0 02/10/2023   CHOLHDL 3 02/10/2023   Lab Results  Component Value Date   HGBA1C 5.8 02/10/2023   Lab Results  Component Value Date   VITAMINB12 926 (H) 02/10/2023   Lab Results  Component Value Date   TSH 1.47 02/10/2023    PHYSICAL EXAM:  Today's Vitals   06/07/23 0810  BP: 118/80  Pulse: 76  Weight: 143 lb 6.4 oz (65 kg)  Height: 5\' 5"  (1.651 m)   Body mass index is 23.86 kg/m.   Wt Readings from Last 3 Encounters:  06/07/23 143 lb 6.4 oz (65 kg)  02/10/23 143 lb (64.9 kg)  12/08/21 137 lb (62.1 kg)     Ht Readings from Last 3 Encounters:  06/07/23 5\' 5"  (1.651 m)  02/10/23 5\' 5"  (1.651 m)  12/08/21 5\' 5"  (1.651 m)      General: The patient is awake, alert and appears not in acute  distress. The patient is well groomed. Head: Normocephalic, atraumatic. Neck is supple.  Mallampati 3,  Low ,low soft palate ,  neck circumference:14 inches . Nasal airflow is not fully patent.   Overbite is seen.  Dental status:  braces  in HS , college  Cardiovascular:  Regular rate and cardiac rhythm by pulse,  without distended neck veins. Respiratory: Lungs are clear to auscultation.  Skin:  Without evidence of ankle edema, or rash. Trunk: The patient's posture is erect.   NEUROLOGIC EXAM: The patient is awake and alert, oriented to place and time.   Memory subjective described as intact.  Attention span & concentration ability appears normal.  Speech is fluent,  with congestion related dysphonia .  Mood and affect are appropriate.   Cranial nerves: no loss of smell or taste reported  Pupils are equal and briskly reactive to light. Funduscopic exam deferred..  Extraocular movements in vertical and horizontal planes were intact and without nystagmus. No Diplopia. Visual fields by finger perimetry are intact. Hearing was intact to soft voice and finger rubbing.    Facial sensation intact to fine touch.  Facial motor strength is symmetric and tongue and uvula move midline.  Neck ROM : rotation, tilt and flexion extension were normal for age and shoulder shrug was symmetrical.     ASSESSMENT AND PLAN:  58 y.o. year old female  here with:    1) Migraine without aura, with nausea and photophobia and vertigo at some days., onset at age 75, not a life  long migraine -  started with perimenopause.   2) frequency increased to 15 a month in February 2025.   3)  uses Imitrex   for acute relief, but needs a preventive ;   PLAN will start Ajovy today as we have samples .  Needs to keep a journal of migraine.   I will prescribe the medication today, sending a request to evaluate for Botox to our specialists .  RV in 3 months with NP.  If no significant progress is made, will send for  BOTOX evaluation and injections.   I would like to thank Colene Dauphin, MD  for allowing me to meet with and to take care of this pleasant patient.   After spending a total time of  30  minutes face to face and additional time for physical and neurologic examination, review of laboratory studies,  personal review of imaging studies, reports and results of other testing and review of referral information / records as far as provided in visit,   Electronically signed by: Neomia Banner, MD 06/07/2023 8:46 AM  [facility] Board certified by The [employer] of Sleep Medicine and Diplomate of the [employer] of Sleep Medicine. Board certified In Neurology through the ABPN, Fellow of the [employer] of Neurology.

## 2023-06-07 NOTE — Patient Instructions (Signed)
 Migraine Headache A migraine headache is an intense pulsing or throbbing pain on one or both sides of the head. Migraine headaches may also cause other symptoms, such as nausea, vomiting, and sensitivity to light and noise. A migraine headache can last from 4 hours to 3 days. Talk with your health care provider about what things may bring on (trigger) your migraine headaches. What are the causes? The exact cause is not known. However, a migraine may be caused when nerves in the brain get irritated and release chemicals that cause blood vessels to become inflamed. This inflammation causes pain. Migraines may be triggered or caused by: Smoking. Medicines, such as: Nitroglycerin, which is used to treat chest pain. Birth control pills. Estrogen. Certain blood pressure medicines. Foods or drinks that contain nitrates, glutamate, aspartame, MSG, or tyramine. Certain foods or drinks, such as aged cheeses, chocolate, alcohol, or caffeine. Doing physical activity that is very hard. Other triggers may include: Menstruation. Pregnancy. Hunger. Stress. Getting too much or too little sleep. Weather changes. Tiredness (fatigue). What increases the risk? The following factors may make you more likely to have migraine headaches: Being between the ages of 33-13 years old. Being female. Having a family history of migraine headaches. Being Caucasian. Having a mental health condition, such as depression or anxiety. Being obese. What are the signs or symptoms? The main symptom of this condition is pulsing or throbbing pain. This pain may: Happen in any area of the head, such as on one or both sides. Make it hard to do daily activities. Get worse with physical activity. Get worse around bright lights, loud noises, or smells. Other symptoms may include: Nausea. Vomiting. Dizziness. Before a migraine headache starts, you may get warning signs (an aura). An aura may include: Seeing flashing lights  or having blind spots. Seeing bright spots, halos, or zigzag lines. Having tunnel vision or blurred vision. Having numbness or a tingling feeling. Having trouble talking. Having muscle weakness. After a migraine ends, you may have symptoms. These may include: Feeling tired. Trouble concentrating. How is this diagnosed? A migraine headache can be diagnosed based on: Your symptoms. A physical exam. Tests, such as: A CT scan or an MRI of the head. These tests can help rule out other causes of headaches. Taking fluid from the spine (lumbar puncture) to examine it (cerebrospinal fluid analysis, or CSF analysis). How is this treated? This condition may be treated with medicines that: Relieve pain and nausea. Prevent migraines. Treatment may also include: Acupuncture. Lifestyle changes like avoiding foods that trigger migraine headaches. Learning ways to control your body (biofeedback). Talk therapy to help you know and deal with negative thoughts (cognitive behavioral therapy). Follow these instructions at home: Medicines Take over-the-counter and prescription medicines only as told by your provider. Ask your provider if the medicine prescribed to you: Requires you to avoid driving or using machinery. Can cause constipation. You may need to take these actions to prevent or treat constipation: Drink enough fluid to keep your pee (urine) pale yellow. Take over-the-counter or prescription medicines. Eat foods that are high in fiber, such as beans, whole grains, and fresh fruits and vegetables. Limit foods that are high in fat and processed sugars, such as fried or sweet foods. Lifestyle  Do not drink alcohol. Do not use any products that contain nicotine or tobacco. These products include cigarettes, chewing tobacco, and vaping devices, such as e-cigarettes. If you need help quitting, ask your provider. Get 7-9 hours of sleep each night, or the  amount recommended by your provider. Find  ways to manage stress, such as meditation, deep breathing, or yoga. Try to exercise regularly. This can help lessen how bad and how often your migraines occur. General instructions Keep a journal to find out what triggers your migraines, so you can avoid those things. For example, write down: What you eat and drink. How much sleep you get. Any change to your diet or medicines. If you have a migraine headache: Avoid things that make your symptoms worse, such as bright lights. Lie down in a dark, quiet room. Do not drive or use machinery. Ask your provider what activities are safe for you while you have symptoms. Keep all follow-up visits. Your provider will monitor your symptoms and recommend any further treatment. Where to find more information Coalition for Headache and Migraine Patients (CHAMP): headachemigraine.org American Migraine Foundation: americanmigrainefoundation.org National Headache Foundation: headaches.org Contact a health care provider if: You have symptoms that are different or worse than your usual migraine headache symptoms. You have more than 15 days of headaches in one month. Get help right away if: Your migraine headache becomes severe or lasts more than 72 hours. You have a fever or stiff neck. You have vision loss. Your muscles feel weak or like you cannot control them. You lose your balance often or have trouble walking. You faint. You have a seizure. This information is not intended to replace advice given to you by your health care provider. Make sure you discuss any questions you have with your health care provider. Document Revised: 09/07/2021 Document Reviewed: 09/07/2021 Elsevier Patient Education  2024 [employer] 58 y.o. year old female  here with:    1) Migraine without aura, with nausea and photophobia and vertigo at some days., onset at age 46, not a life  long migraine -  started with perimenopause.   2) frequency increased to 15 a month in  February 2025.   3)  uses Imitrex   for acute relief, but needs a preventive ;   PLAN will start Emgality / Ajovy.  Needs to keep a journal of migraine.   I will prescribe the medication today, sending a request to evaluate for botox to our specialists .  RV in 3 months with NP.

## 2023-06-29 ENCOUNTER — Encounter: Payer: Self-pay | Admitting: Neurology

## 2023-06-29 ENCOUNTER — Encounter: Payer: Self-pay | Admitting: Internal Medicine

## 2023-06-29 MED ORDER — AJOVY 225 MG/1.5ML ~~LOC~~ SOAJ: 225.0000 mg | SUBCUTANEOUS | 3 refills | Status: DC

## 2023-06-30 ENCOUNTER — Other Ambulatory Visit: Payer: Self-pay

## 2023-06-30 DIAGNOSIS — R300 Dysuria: Secondary | ICD-10-CM

## 2023-07-01 ENCOUNTER — Ambulatory Visit
Admission: EM | Admit: 2023-07-01 | Discharge: 2023-07-01 | Disposition: A | Discharge status: Home / Self Care | Attending: Family Medicine | Admitting: Family Medicine

## 2023-07-01 ENCOUNTER — Other Ambulatory Visit: Payer: Self-pay

## 2023-07-01 DIAGNOSIS — R300 Dysuria: Secondary | ICD-10-CM | POA: Diagnosis present

## 2023-07-01 LAB — POCT URINALYSIS DIP (MANUAL ENTRY)
Bilirubin, UA: NEGATIVE
Glucose, UA: 100 mg/dL — AB
Nitrite, UA: POSITIVE — AB
Protein Ur, POC: 100 mg/dL — AB
Spec Grav, UA: 1.01 (ref 1.010–1.025)
Urobilinogen, UA: 1 U/dL
pH, UA: 5.5 (ref 5.0–8.0)

## 2023-07-01 MED ORDER — FOSFOMYCIN TROMETHAMINE 3 G PO PACK
3.0000 g | PACK | Freq: Once | ORAL | 0 refills | Status: AC
Start: 2023-07-01 — End: 2023-07-01

## 2023-07-01 NOTE — ED Provider Notes (Signed)
 [facility]    CSN: [id number] Arrival date & time: 07/01/23  1935      History   Chief Complaint Chief Complaint  Patient presents with   Dysuria    HPI Kathleen Hays is a 58 y.o. female.   Three days ago patient suddenly developed lower abdominal pressure sensation, urinary urgency, and cloudy urine followed by onset of nocturia.  She does not feel quite well but does not feel ill.  She denies abdominal/pelvic pain, flank pain, vaginal discharge, nausea/vomiting, and fevers, chills, and sweats.  She began taking AZO today which has been helpful.  She has several antibiotic allergies and intolerances, but recalls that fosfomycin prescribed by her PCP Dr Donnette Gal in 2021 worked well without adverse effects. Patient's last menstrual period was 01/25/2013 (approximate).   The history is provided by the patient.    Past Medical History:  Diagnosis Date   Depression    PTSD secondary to witnessed father suicide (gunshot to chest)   Family history of adverse reaction to anesthesia    brother is combative after anesthesia, mother has confusion.   HA (headache)    Kidney stone     Patient Active Problem List   Diagnosis Date Noted   Hyperglycemia 02/10/2023   History of nephrolithiasis 10/20/2020   Family history of diabetes mellitus in mother 10/19/2020   Vitamin D  deficiency 03/03/2018   Low vitamin B12 level 09/08/2017   Osteopenia 09/08/2017   Family history of pernicious anemia 03/02/2017   Hx of anorexia nervosa 01/23/2016   History of colonic polyps 01/23/2016   Migraines     Past Surgical History:  Procedure Laterality Date   CESAREAN SECTION     EXTRACORPOREAL SHOCK WAVE LITHOTRIPSY Right 03/10/2020   Procedure: EXTRACORPOREAL SHOCK WAVE LITHOTRIPSY (ESWL);  Surgeon: Samson Croak, MD;  Location: [facility];  Service: Urology;  Laterality: Right;   LITHOTRIPSY     MOUTH SURGERY     TONSILLECTOMY AND ADENOIDECTOMY     WISDOM  TOOTH EXTRACTION Bilateral 1984   Patient was 58 years old.     OB History   No obstetric history on file.      Home Medications    Prior to Admission medications   Medication Sig Start Date End Date Taking? Authorizing Provider  fosfomycin (MONUROL ) 3 g PACK Take 3 g by mouth once for 1 dose. Mix well with 3 -4oz water. Consume entire dose immediately. 07/01/23 07/01/23 Yes Leon Rajas, MD  Fremanezumab -vfrm (AJOVY ) 225 MG/1.5ML SOAJ Inject 225 mg into the skin every 30 (thirty) days. 06/29/23   Dohmeier, Raoul Byes, MD  SUMAtriptan  (IMITREX ) 100 MG tablet MAY REPEAT IN 2 HOURS IF HEADACHE PERSISTS OR RECURS. 03/15/23   Colene Dauphin, MD    Family History Family History  Problem Relation Age of Onset   High blood pressure Mother    Migraines Mother    Thyroid  disease Mother    Depression Father    Suicidality Father        gunshot   Diabetes Maternal Grandfather    Diabetes Paternal Grandmother     Social History Social History   Tobacco Use   Smoking status: Former    Current packs/day: 0.00    Types: Cigarettes    Quit date: 2010    Years since quitting: 15.4   Smokeless tobacco: Never   Tobacco comments:    Quit 2015  Vaping Use   Vaping status: Former  Substance Use Topics  Alcohol use: No    Alcohol/week: 0.0 standard drinks of alcohol   Drug use: No     Allergies   Macrobid  [nitrofurantoin  macrocrystal], Avelox [moxifloxacin hcl in nacl], Augmentin  [amoxicillin -pot clavulanate], and Bactrim [sulfamethoxazole-trimethoprim]   Review of Systems Review of Systems  Constitutional:  Negative for activity change, appetite change, chills, diaphoresis, fatigue and fever.  Genitourinary:  Positive for frequency and urgency. Negative for difficulty urinating, dysuria, flank pain, hematuria, pelvic pain and vaginal discharge.  All other systems reviewed and are negative.    Physical Exam Triage Vital Signs ED Triage Vitals  Encounter Vitals Group     BP  07/01/23 1940 112/73     Systolic BP Percentile --      Diastolic BP Percentile --      Pulse Rate 07/01/23 1940 74     Resp 07/01/23 1940 16     Temp 07/01/23 1940 99.1 F (37.3 C)     Temp src --      SpO2 07/01/23 1940 93 %     Weight --      Height --      Head Circumference --      Peak Flow --      Pain Score 07/01/23 1942 0     Pain Loc --      Pain Education --      Exclude from Growth Chart --    No data found.  Updated Vital Signs BP 112/73   Pulse 74   Temp 99.1 F (37.3 C)   Resp 16   LMP 01/25/2013 (Approximate)   SpO2 93%   Visual Acuity Right Eye Distance:   Left Eye Distance:   Bilateral Distance:    Right Eye Near:   Left Eye Near:    Bilateral Near:     Physical Exam Nursing notes and Vital Signs reviewed. Appearance:  Patient appears stated age, and in no acute distress.    Eyes:  Pupils are equal, round, and reactive to light and accomodation.  Extraocular movement is intact.  Conjunctivae are not inflamed   Pharynx:  Normal; moist mucous membranes  Neck:  Supple.  No adenopathy Lungs:  Clear to auscultation.  Breath sounds are equal.  Moving air well. Heart:  Regular rate and rhythm without murmurs, rubs, or gallops.  Abdomen:  Nontender without masses or hepatosplenomegaly.  Bowel sounds are present.  No CVA or flank tenderness.  Extremities:  No edema.  Skin:  No rash present.     UC Treatments / Results  Labs (all labs ordered are listed, but only abnormal results are displayed) Labs Reviewed  POCT URINALYSIS DIP (MANUAL ENTRY) - Abnormal; Notable for the following components:      Result Value   Color, UA orange (*)    Glucose, UA =100 (*)    Ketones, POC UA trace (5) (*)    Blood, UA small (*)    Protein Ur, POC =100 (*)    Nitrite, UA Positive (*)    Leukocytes, UA Large (3+) (*)    All other components within normal limits  URINE CULTURE    EKG   Radiology No results found.  Procedures Procedures (including  critical care time)  Medications Ordered in UC Medications - No data to display  Initial Impression / Assessment and Plan / UC Course  I have reviewed the triage vital signs and the nursing notes.  Pertinent labs & imaging results that were available during my care of the patient were  reviewed by me and considered in my medical decision making (see chart for details).    Suspect acute cystitis.  Urine culture pending. Begin fosfomycin 3gm one dose. Followup with Family Doctor if not improved in five days.  Final Clinical Impressions(s) / UC Diagnoses   Final diagnoses:  Dysuria     Discharge Instructions      Increase fluid intake. May use non-prescription AZO for about two days, if desired, to decrease urinary discomfort.   If symptoms become significantly worse during the night or over the weekend, proceed to the local emergency room.   ED Prescriptions     Medication Sig Dispense Auth. Provider   fosfomycin (MONUROL ) 3 g PACK Take 3 g by mouth once for 1 dose. Mix well with 3 -4oz water. Consume entire dose immediately. 3 g Leon Rajas, MD         Leon Rajas, MD 07/02/23 [phone]

## 2023-07-01 NOTE — ED Triage Notes (Signed)
 X 3 days has had pressure when urinating, achiness, don't feel good, urine cloudy. Has been taking azo.

## 2023-07-01 NOTE — Discharge Instructions (Signed)
Increase fluid intake. May use non-prescription AZO for about two days, if desired, to decrease urinary discomfort.  If symptoms become significantly worse during the night or over the weekend, proceed to the local emergency room.  

## 2023-07-04 ENCOUNTER — Other Ambulatory Visit (HOSPITAL_COMMUNITY): Payer: Self-pay

## 2023-07-04 ENCOUNTER — Ambulatory Visit (HOSPITAL_COMMUNITY): Payer: Self-pay

## 2023-07-04 ENCOUNTER — Telehealth: Payer: Self-pay

## 2023-07-04 LAB — URINE CULTURE: Culture: >=100000 — AB

## 2023-07-04 NOTE — Telephone Encounter (Signed)
 Pharmacy Patient Advocate Encounter  Received notification from [facility] that Prior Authorization for AJOVY  (fremanezumab -vfrm) injection 225MG /1.5ML auto-injectors has been APPROVED from 07/04/2023 to 10/02/2023. Ran test claim, Copay is $24.98. This test claim was processed through [facility]- copay amounts may vary at other pharmacies due to pharmacy/plan contracts, or as the patient moves through the different stages of their insurance plan.   PA #/Case ID/Reference #: PA Case ID #: [id number]

## 2023-07-04 NOTE — Telephone Encounter (Signed)
 Pharmacy Patient Advocate Encounter   Received notification from Patient Advice Request messages that prior authorization for AJOVY  (fremanezumab -vfrm) injection 225MG /1.5ML auto-injectors is required/requested.   Insurance verification completed.   The patient is insured through [facility] .   Per test claim: PA required; PA submitted to above mentioned insurance via CoverMyMeds Key/confirmation #/EOC [id number] Status is pending

## 2023-07-06 ENCOUNTER — Encounter: Payer: Self-pay | Admitting: Internal Medicine

## 2023-07-06 MED ORDER — AJOVY 225 MG/1.5ML ~~LOC~~ SOAJ: 225.0000 mg | SUBCUTANEOUS | 3 refills | Status: AC

## 2023-07-06 NOTE — Addendum Note (Signed)
 Addended by: Randi Buster on: 07/06/2023 08:37 AM   Modules accepted: Orders

## 2023-07-07 ENCOUNTER — Encounter: Payer: Self-pay | Admitting: Neurology

## 2023-07-07 ENCOUNTER — Other Ambulatory Visit (HOSPITAL_COMMUNITY): Payer: Self-pay

## 2023-07-07 ENCOUNTER — Other Ambulatory Visit: Payer: Self-pay

## 2023-07-07 MED ORDER — AJOVY 225 MG/1.5ML ~~LOC~~ SOAJ
225.0000 mg | SUBCUTANEOUS | 3 refills | Status: AC
  Filled 2023-07-07: qty 1.5, 30d supply, fill #0
  Filled 2023-08-02: qty 1.5, 30d supply, fill #1
  Filled 2023-09-01: qty 1.5, 30d supply, fill #2
  Filled 2023-10-03 – 2023-10-10 (×5): qty 1.5, 30d supply, fill #3

## 2023-07-11 ENCOUNTER — Other Ambulatory Visit (HOSPITAL_COMMUNITY): Payer: Self-pay

## 2023-09-01 ENCOUNTER — Other Ambulatory Visit (HOSPITAL_COMMUNITY): Payer: Self-pay

## 2023-09-01 ENCOUNTER — Encounter: Payer: Self-pay | Admitting: Neurology

## 2023-09-02 ENCOUNTER — Other Ambulatory Visit (HOSPITAL_COMMUNITY): Payer: Self-pay

## 2023-09-02 ENCOUNTER — Telehealth: Payer: Self-pay

## 2023-09-02 NOTE — Telephone Encounter (Signed)
 Pharmacy Patient Advocate Encounter   Received notification from Patient Advice Request messages that prior authorization for AJOVY  (fremanezumab -vfrm) injection 225MG /1.5ML auto-injectors is required/requested.   Insurance verification completed.   The patient is insured through [facility] .   Per test claim: PA required; PA started via CoverMyMeds. KEY [id number] . Waiting for clinical questions to populate.

## 2023-09-05 ENCOUNTER — Other Ambulatory Visit (HOSPITAL_COMMUNITY): Payer: Self-pay

## 2023-09-08 ENCOUNTER — Other Ambulatory Visit (HOSPITAL_COMMUNITY): Payer: Self-pay

## 2023-09-08 NOTE — Telephone Encounter (Signed)
 Pharmacy Patient Advocate Encounter   Received notification from Patient Advice Request messages that prior authorization for Ajovy  is required/requested.   Insurance verification completed.   The patient is insured through [facility] .   Per test claim: PA required; PA submitted to above mentioned insurance via Latent Key/confirmation #/EOC [id number] Status is pending  Previous questions expired-resubmitted. Awaiting clinical questions to populate.

## 2023-09-09 ENCOUNTER — Other Ambulatory Visit (HOSPITAL_COMMUNITY): Payer: Self-pay

## 2023-09-09 NOTE — Telephone Encounter (Signed)
 Sent the patient a mychart message indicating the information from pharmacy

## 2023-09-09 NOTE — Telephone Encounter (Signed)
   Per test claim  Pharmacy Patient Advocate Encounter   Received notification from Patient Advice Request messages that prior authorization for Ajovy  is required/requested.   Insurance verification completed.   The patient is insured through [facility] .   Per test claim: Refill too soon. PA is not needed at this time. Medication was filled 09/01/2023. Next eligible fill date is 09/20/2023. Was filled at [facility] pharmacy Phone: [phone]

## 2023-10-04 ENCOUNTER — Other Ambulatory Visit (HOSPITAL_COMMUNITY): Payer: Self-pay

## 2023-10-05 ENCOUNTER — Other Ambulatory Visit (HOSPITAL_COMMUNITY): Payer: Self-pay

## 2023-10-05 ENCOUNTER — Encounter: Payer: Self-pay | Admitting: Neurology

## 2023-10-10 ENCOUNTER — Other Ambulatory Visit (HOSPITAL_COMMUNITY): Payer: Self-pay

## 2023-10-10 ENCOUNTER — Telehealth: Payer: Self-pay

## 2023-10-10 NOTE — Telephone Encounter (Signed)
 Pharmacy Patient Advocate Encounter  Received notification from [facility] that Prior Authorization for Ajovy  has been APPROVED from 10/10/2023 to 10/09/2024. Ran test claim, Copay is $520.51 per 30 DS. This test claim was processed through [facility]- copay amounts may vary at other pharmacies due to pharmacy/plan contracts, or as the patient moves through the different stages of their insurance plan.   PA #/Case ID/Reference #: [id number]  XZB:[id number]

## 2023-10-11 ENCOUNTER — Other Ambulatory Visit (HOSPITAL_COMMUNITY): Payer: Self-pay

## 2023-10-13 NOTE — Progress Notes (Unsigned)
 PATIENT: Kathleen Hays DOB: 1965-12-13  REASON FOR VISIT: follow up HISTORY FROM: patient  HISTORY OF PRESENT ILLNESS: Today 10/13/23:  Kathleen Hays is a 58 y.o. female with a history of ***. Returns today for follow-up.   06/07/23 (Copied from Dr.Dohmeier's note) Kathleen Hays is a 58 y.o. female patient who is here for a new visit  on 06/07/2023 for  Migraine  re - evaluation .    She is wanting to look at new preventive medications, has thought about Botox and about Emgality etc. She doesn't  have  a preventive on board now. Didn't tolerateTopamax.  May have been on propanolol,  verapamil - not with success. Last 3 months her migraine frequency exceeded 15 / months.  She has been a patient of Dr Velinda and hat many, many injections, but I have not seen the list of medications tried under his care.     02/16/21 (MM): Kathleen Hays is a 58 year old female with a history of migraine headaches.  She returns today for follow-up.  Overall she is doing well.  She continues to use Imitrex  when needed.  Currently only having 1 headache a month.  Sometimes is every 2 months.  Denies any new symptoms.  Returns today for an evaluation.  11/19/19: Kathleen Hays is a 58 year old female with a history of migraine headaches. She returns today for follow-up. Overall she feels that she is doing well. She continues to only use imitrex  when she gets a headache. She has approximately 1 headache every 2 months.  She feels that some of her headaches are related to her allergies.  She is unable to take allergy medication as it causes nosebleeds.  The patient also mentions that her mom and grandmother had Alzheimer's disease.  She has questions about whether she will also develop this.  She does not currently have any issues with her memory.  HISTORY 11/06/18:   Kathleen Hays is a 58 year old female with a history of migraine headaches.  She returns today for follow-up.  She states that her  headache frequency was doing well until she began wearing mask.  She states that she was having to wear a mask and work outdoors during the heat.  She states that her headaches increased to at least once a week.  Imitrex  continues to work well for her.  She states that since we have cooler weather her headaches have decreased again.  She has not had a migraine since September.  She also states that she has began having nosebleeds.  At least 3 times a week.  She has not seen her PCP or ENT for this.  She returns today for an evaluation.  REVIEW OF SYSTEMS: Out of a complete 14 system review of symptoms, the patient complains only of the following symptoms, and all other reviewed systems are negative.  See HPI  ALLERGIES: Allergies  Allergen Reactions   Macrobid  [Nitrofurantoin  Macrocrystal] Other (See Comments)    Cause severe migraines and vomiting   Avelox [Moxifloxacin Hcl In Nacl] Swelling   Augmentin  [Amoxicillin -Pot Clavulanate]     nausea   Bactrim [Sulfamethoxazole-Trimethoprim] Nausea And Vomiting    HOME MEDICATIONS: Outpatient Medications Prior to Visit  Medication Sig Dispense Refill   Fremanezumab -vfrm (AJOVY ) 225 MG/1.5ML SOAJ Inject 225 mg into the skin every 30 (thirty) days. 1.5 mL 3   Fremanezumab -vfrm (AJOVY ) 225 MG/1.5ML SOAJ Inject 225 mg into the skin every 30 (thirty) days. 1.5 mL 3   SUMAtriptan  (IMITREX )  100 MG tablet MAY REPEAT IN 2 HOURS IF HEADACHE PERSISTS OR RECURS. 10 tablet 11   No facility-administered medications prior to visit.    PAST MEDICAL HISTORY: Past Medical History:  Diagnosis Date   Depression    PTSD secondary to witnessed father suicide (gunshot to chest)   Family history of adverse reaction to anesthesia    brother is combative after anesthesia, mother has confusion.   HA (headache)    Kidney stone     PAST SURGICAL HISTORY: Past Surgical History:  Procedure Laterality Date   CESAREAN SECTION     EXTRACORPOREAL SHOCK WAVE  LITHOTRIPSY Right 03/10/2020   Procedure: EXTRACORPOREAL SHOCK WAVE LITHOTRIPSY (ESWL);  Surgeon: Carolee Sherwood JONETTA DOUGLAS, MD;  Location: [facility];  Service: Urology;  Laterality: Right;   LITHOTRIPSY     MOUTH SURGERY     TONSILLECTOMY AND ADENOIDECTOMY     WISDOM TOOTH EXTRACTION Bilateral 1984   Patient was 58 years old.     FAMILY HISTORY: Family History  Problem Relation Age of Onset   High blood pressure Mother    Migraines Mother    Thyroid  disease Mother    Depression Father    Suicidality Father        gunshot   Diabetes Maternal Grandfather    Diabetes Paternal Grandmother     SOCIAL HISTORY: Social History   Socioeconomic History   Marital status: Married    Spouse name: Jerel   Number of children: 2   Years of education: RN   Highest education level: Not on file  Occupational History   Occupation: Engineering geologist: [employer]  Tobacco Use   Smoking status: Former    Current packs/day: 0.00    Types: Cigarettes    Quit date: 2010    Years since quitting: 15.7   Smokeless tobacco: Never   Tobacco comments:    Quit 2015  Vaping Use   Vaping status: Former  Substance and Sexual Activity   Alcohol use: No    Alcohol/week: 0.0 standard drinks of alcohol   Drug use: No   Sexual activity: Yes    Partners: Male    Birth control/protection: Other-see comments, Post-menopausal    Comment: Without menstral cycle for 3 year on January 2018  Other Topics Concern   Not on file  Social History Narrative   Patient lives at home with her husband Jerel  and daughter.   Patient works at [employer] and [employer]    Civil Service fast streamer.   Right handed.   Caffeine none.   Social Drivers of Corporate investment banker Strain: Not on file  Food Insecurity: Not on file  Transportation Needs: Not on file  Physical Activity: Not on file  Stress: Not on file  Social Connections: Not on file  Intimate Partner Violence: Not on file      PHYSICAL  EXAM  There were no vitals filed for this visit.  There is no height or weight on file to calculate BMI.  Generalized: Well developed, in no acute distress   Neurological examination  Mentation: Alert oriented to time, place, history taking. Follows all commands speech and language fluent Cranial nerve II-XII: Pupils were equal round reactive to light. Extraocular movements were full, visual field were full on confrontational test. Head turning and shoulder shrug  were normal and symmetric. Motor: The motor testing reveals 5 over 5 strength of all 4 extremities. Good symmetric motor tone is noted  throughout.  Sensory: Sensory testing is intact to soft touch on all 4 extremities. No evidence of extinction is noted.  Coordination: Cerebellar testing reveals good finger-nose-finger and heel-to-shin bilaterally.  Gait and station: Gait is normal.  Reflexes: Deep tendon reflexes are symmetric and normal bilaterally.   DIAGNOSTIC DATA (LABS, IMAGING, TESTING) - I reviewed patient records, labs, notes, testing and imaging myself where available.  Lab Results  Component Value Date   WBC 5.0 02/10/2023   HGB 14.7 02/10/2023   HCT 43.5 02/10/2023   MCV 92.5 02/10/2023   PLT 194.0 02/10/2023      Component Value Date/Time   NA 141 02/10/2023 0838   K 4.2 02/10/2023 0838   CL 103 02/10/2023 0838   CO2 29 02/10/2023 0838   GLUCOSE 103 (H) 02/10/2023 0838   BUN 17 02/10/2023 0838   CREATININE 0.69 02/10/2023 0838   CALCIUM 9.2 02/10/2023 0838   PROT 6.5 02/10/2023 0838   ALBUMIN 4.5 02/10/2023 0838   AST 18 02/10/2023 0838   ALT 18 02/10/2023 0838   ALKPHOS 38 (L) 02/10/2023 0838   BILITOT 0.4 02/10/2023 0838   GFRNONAA >60 03/04/2020 1255   Lab Results  Component Value Date   CHOL 269 (H) 02/10/2023   HDL 94.20 02/10/2023   LDLCALC 164 (H) 02/10/2023   TRIG 53.0 02/10/2023   CHOLHDL 3 02/10/2023   Lab Results  Component Value Date   HGBA1C 5.8 02/10/2023   Lab Results   Component Value Date   VITAMINB12 926 (H) 02/10/2023   Lab Results  Component Value Date   TSH 1.47 02/10/2023      ASSESSMENT AND PLAN 58 y.o. year old female  has a past medical history of Depression, Family history of adverse reaction to anesthesia, HA (headache), and Kidney stone. here with:  1. Migraine headaches   Patient has remained stable.   Continue Imitrex  as abortive therapy Follow-up with our office on an as-needed basis.  Continue regular follow-ups with her PCP.  Future refills can be obtained through her PCP   Duwaine Russell, MSN, NP-C 10/13/2023, 3:50 PM [facility] [address], [address] [place], [place] [address] [phone]

## 2023-10-14 ENCOUNTER — Ambulatory Visit: Admitting: Adult Health

## 2023-10-14 ENCOUNTER — Encounter: Payer: Self-pay | Admitting: Adult Health

## 2023-10-14 VITALS — BP 108/67 | HR 60 | Ht 65.0 in | Wt 140.4 lb

## 2023-10-14 DIAGNOSIS — G43009 Migraine without aura, not intractable, without status migrainosus: Secondary | ICD-10-CM | POA: Diagnosis not present

## 2023-10-14 NOTE — Patient Instructions (Addendum)
 Your Plan:  Continue Ajovy   Other meds we could try is Emgality, aimovig, qulipta Continue imitrex  If your symptoms worsen or you develop new symptoms please let us  know.       Thank you for coming to see us  at [facility]. I hope we have been able to provide you high quality care today.  You may receive a patient satisfaction survey over the next few weeks. We would appreciate your feedback and comments so that we may continue to improve ourselves and the health of our patients.

## 2024-02-09 ENCOUNTER — Encounter: Payer: Self-pay | Admitting: Adult Health

## 2024-02-20 ENCOUNTER — Other Ambulatory Visit (HOSPITAL_COMMUNITY): Payer: Self-pay

## 2024-02-20 NOTE — Telephone Encounter (Signed)
PA is not required.

## 2024-10-15 ENCOUNTER — Ambulatory Visit: Admitting: Adult Health
